# Patient Record
Sex: Male | Born: 1945
Health system: Southern US, Community
[De-identification: ages and names within clinical notes are randomized; demographics above are authoritative.]

## PROBLEM LIST (undated history)

## (undated) DIAGNOSIS — F32A Depression, unspecified: Secondary | ICD-10-CM

## (undated) DIAGNOSIS — K219 Gastro-esophageal reflux disease without esophagitis: Secondary | ICD-10-CM

## (undated) DIAGNOSIS — Z87442 Personal history of urinary calculi: Secondary | ICD-10-CM

## (undated) DIAGNOSIS — R3915 Urgency of urination: Secondary | ICD-10-CM

## (undated) DIAGNOSIS — N400 Enlarged prostate without lower urinary tract symptoms: Secondary | ICD-10-CM

## (undated) DIAGNOSIS — F329 Major depressive disorder, single episode, unspecified: Secondary | ICD-10-CM

## (undated) DIAGNOSIS — K635 Polyp of colon: Secondary | ICD-10-CM

## (undated) DIAGNOSIS — D1803 Hemangioma of intra-abdominal structures: Secondary | ICD-10-CM

## (undated) DIAGNOSIS — Z973 Presence of spectacles and contact lenses: Secondary | ICD-10-CM

## (undated) DIAGNOSIS — F419 Anxiety disorder, unspecified: Secondary | ICD-10-CM

## (undated) DIAGNOSIS — T7840XA Allergy, unspecified, initial encounter: Secondary | ICD-10-CM

## (undated) DIAGNOSIS — E119 Type 2 diabetes mellitus without complications: Secondary | ICD-10-CM

## (undated) DIAGNOSIS — G629 Polyneuropathy, unspecified: Secondary | ICD-10-CM

## (undated) DIAGNOSIS — R319 Hematuria, unspecified: Secondary | ICD-10-CM

## (undated) DIAGNOSIS — N2889 Other specified disorders of kidney and ureter: Secondary | ICD-10-CM

## (undated) DIAGNOSIS — E11319 Type 2 diabetes mellitus with unspecified diabetic retinopathy without macular edema: Secondary | ICD-10-CM

## (undated) DIAGNOSIS — G4733 Obstructive sleep apnea (adult) (pediatric): Secondary | ICD-10-CM

## (undated) DIAGNOSIS — I440 Atrioventricular block, first degree: Secondary | ICD-10-CM

## (undated) DIAGNOSIS — N201 Calculus of ureter: Secondary | ICD-10-CM

## (undated) DIAGNOSIS — J45909 Unspecified asthma, uncomplicated: Secondary | ICD-10-CM

## (undated) DIAGNOSIS — R300 Dysuria: Secondary | ICD-10-CM

## (undated) DIAGNOSIS — M6530 Trigger finger, unspecified finger: Secondary | ICD-10-CM

## (undated) HISTORY — DX: Allergy, unspecified, initial encounter: T78.40XA

## (undated) HISTORY — DX: Polyneuropathy, unspecified: G62.9

## (undated) HISTORY — DX: Atrioventricular block, first degree: I44.0

## (undated) HISTORY — DX: Unspecified asthma, uncomplicated: J45.909

## (undated) HISTORY — DX: Trigger finger, unspecified finger: M65.30

## (undated) HISTORY — DX: Major depressive disorder, single episode, unspecified: F32.9

## (undated) HISTORY — DX: Anxiety disorder, unspecified: F41.9

## (undated) HISTORY — DX: Obstructive sleep apnea (adult) (pediatric): G47.33

## (undated) HISTORY — DX: Type 2 diabetes mellitus with unspecified diabetic retinopathy without macular edema: E11.319

## (undated) HISTORY — DX: Hemangioma of intra-abdominal structures: D18.03

## (undated) HISTORY — DX: Depression, unspecified: F32.A

## (undated) HISTORY — PX: COLONOSCOPY: SHX174

## (undated) HISTORY — DX: Polyp of colon: K63.5

## (undated) HISTORY — DX: Benign prostatic hyperplasia without lower urinary tract symptoms: N40.0

---

## 1999-01-31 ENCOUNTER — Encounter: Admission: RE | Admit: 1999-01-31 | Discharge: 1999-05-01 | Payer: Self-pay | Admitting: Endocrinology

## 2005-05-15 ENCOUNTER — Ambulatory Visit: Payer: Self-pay | Admitting: Internal Medicine

## 2005-05-29 ENCOUNTER — Encounter (INDEPENDENT_AMBULATORY_CARE_PROVIDER_SITE_OTHER): Payer: Self-pay | Admitting: *Deleted

## 2005-05-29 ENCOUNTER — Ambulatory Visit: Payer: Self-pay | Admitting: Internal Medicine

## 2007-02-17 ENCOUNTER — Encounter: Admission: RE | Admit: 2007-02-17 | Discharge: 2007-02-17 | Payer: Self-pay | Admitting: Orthopedic Surgery

## 2007-07-01 ENCOUNTER — Encounter: Admission: RE | Admit: 2007-07-01 | Discharge: 2007-07-01 | Payer: Self-pay | Admitting: Family Medicine

## 2007-07-13 ENCOUNTER — Encounter: Admission: RE | Admit: 2007-07-13 | Discharge: 2007-07-13 | Payer: Self-pay | Admitting: Family Medicine

## 2008-03-16 ENCOUNTER — Encounter: Admission: RE | Admit: 2008-03-16 | Discharge: 2008-03-16 | Payer: Self-pay | Admitting: Family Medicine

## 2008-06-13 ENCOUNTER — Encounter: Admission: RE | Admit: 2008-06-13 | Discharge: 2008-06-13 | Payer: Self-pay | Admitting: Family Medicine

## 2008-07-13 ENCOUNTER — Ambulatory Visit: Payer: Self-pay | Admitting: Internal Medicine

## 2008-08-17 ENCOUNTER — Encounter: Admission: RE | Admit: 2008-08-17 | Discharge: 2008-08-17 | Payer: Self-pay | Admitting: Family Medicine

## 2008-09-11 ENCOUNTER — Ambulatory Visit: Payer: Self-pay | Admitting: Internal Medicine

## 2008-09-26 ENCOUNTER — Ambulatory Visit: Payer: Self-pay | Admitting: Internal Medicine

## 2010-05-26 ENCOUNTER — Encounter: Payer: Self-pay | Admitting: Orthopedic Surgery

## 2010-08-13 LAB — GLUCOSE, CAPILLARY: Glucose-Capillary: 124 mg/dL — ABNORMAL HIGH (ref 70–99)

## 2010-10-17 ENCOUNTER — Ambulatory Visit (HOSPITAL_COMMUNITY): Payer: BC Managed Care – PPO

## 2010-10-17 ENCOUNTER — Ambulatory Visit (HOSPITAL_COMMUNITY)
Admission: RE | Admit: 2010-10-17 | Discharge: 2010-10-17 | Disposition: A | Discharge status: Home / Self Care | Payer: BC Managed Care – PPO | Source: Ambulatory Visit | Attending: Urology | Admitting: Urology

## 2010-10-17 DIAGNOSIS — K219 Gastro-esophageal reflux disease without esophagitis: Secondary | ICD-10-CM | POA: Insufficient documentation

## 2010-10-17 DIAGNOSIS — E119 Type 2 diabetes mellitus without complications: Secondary | ICD-10-CM | POA: Insufficient documentation

## 2010-10-17 DIAGNOSIS — N201 Calculus of ureter: Secondary | ICD-10-CM | POA: Insufficient documentation

## 2010-10-17 DIAGNOSIS — J45909 Unspecified asthma, uncomplicated: Secondary | ICD-10-CM | POA: Insufficient documentation

## 2010-10-17 DIAGNOSIS — Z01812 Encounter for preprocedural laboratory examination: Secondary | ICD-10-CM | POA: Insufficient documentation

## 2010-10-17 HISTORY — PX: EXTRACORPOREAL SHOCK WAVE LITHOTRIPSY: SHX1557

## 2010-10-17 LAB — GLUCOSE, CAPILLARY

## 2010-10-19 ENCOUNTER — Emergency Department (HOSPITAL_COMMUNITY): Payer: BC Managed Care – PPO

## 2010-10-19 ENCOUNTER — Emergency Department (HOSPITAL_COMMUNITY)
Admission: EM | Admit: 2010-10-19 | Discharge: 2010-10-19 | Disposition: A | Discharge status: Home / Self Care | Payer: BC Managed Care – PPO | Attending: Emergency Medicine | Admitting: Emergency Medicine

## 2010-10-19 DIAGNOSIS — R079 Chest pain, unspecified: Secondary | ICD-10-CM | POA: Insufficient documentation

## 2010-10-19 DIAGNOSIS — W19XXXA Unspecified fall, initial encounter: Secondary | ICD-10-CM | POA: Insufficient documentation

## 2010-10-19 DIAGNOSIS — R319 Hematuria, unspecified: Secondary | ICD-10-CM | POA: Insufficient documentation

## 2010-10-19 DIAGNOSIS — M549 Dorsalgia, unspecified: Secondary | ICD-10-CM | POA: Insufficient documentation

## 2010-10-19 DIAGNOSIS — E119 Type 2 diabetes mellitus without complications: Secondary | ICD-10-CM | POA: Insufficient documentation

## 2010-10-19 DIAGNOSIS — R51 Headache: Secondary | ICD-10-CM | POA: Insufficient documentation

## 2010-10-19 LAB — BASIC METABOLIC PANEL
BUN: 25 mg/dL — ABNORMAL HIGH (ref 6–23)
CO2: 28 mEq/L (ref 19–32)
Calcium: 9.7 mg/dL (ref 8.4–10.5)
Chloride: 100 mEq/L (ref 96–112)
Creatinine, Ser: 1.75 mg/dL — ABNORMAL HIGH (ref 0.50–1.35)
GFR calc non Af Amer: 39 mL/min — ABNORMAL LOW (ref >60)
Glucose, Bld: 174 mg/dL — ABNORMAL HIGH (ref 70–99)
Potassium: 4.2 mEq/L (ref 3.5–5.1)

## 2010-10-19 LAB — DIFFERENTIAL
Basophils Absolute: 0 10*3/uL (ref 0.0–0.1)
Basophils Relative: 0 % (ref 0–1)
Eosinophils Absolute: 0.1 10*3/uL (ref 0.0–0.7)
Neutro Abs: 7.1 10*3/uL (ref 1.7–7.7)

## 2010-10-19 LAB — URINALYSIS, ROUTINE W REFLEX MICROSCOPIC
Glucose, UA: 250 mg/dL — AB
Protein, ur: NEGATIVE mg/dL
pH: 5.5 (ref 5.0–8.0)

## 2010-10-19 LAB — URINE MICROSCOPIC-ADD ON

## 2010-10-19 LAB — CBC
HCT: 39 % (ref 39.0–52.0)
MCV: 91.5 fL (ref 78.0–100.0)
RBC: 4.26 MIL/uL (ref 4.22–5.81)
RDW: 11.7 % (ref 11.5–15.5)
WBC: 8.8 10*3/uL (ref 4.0–10.5)

## 2012-12-15 DIAGNOSIS — G4700 Insomnia, unspecified: Secondary | ICD-10-CM | POA: Diagnosis not present

## 2012-12-27 DIAGNOSIS — N200 Calculus of kidney: Secondary | ICD-10-CM | POA: Diagnosis not present

## 2012-12-27 DIAGNOSIS — R82998 Other abnormal findings in urine: Secondary | ICD-10-CM | POA: Diagnosis not present

## 2013-01-25 DIAGNOSIS — R300 Dysuria: Secondary | ICD-10-CM | POA: Diagnosis not present

## 2013-01-25 DIAGNOSIS — R5381 Other malaise: Secondary | ICD-10-CM | POA: Diagnosis not present

## 2013-01-25 DIAGNOSIS — Z125 Encounter for screening for malignant neoplasm of prostate: Secondary | ICD-10-CM | POA: Diagnosis not present

## 2013-01-25 DIAGNOSIS — R319 Hematuria, unspecified: Secondary | ICD-10-CM | POA: Diagnosis not present

## 2013-01-26 DIAGNOSIS — R339 Retention of urine, unspecified: Secondary | ICD-10-CM | POA: Diagnosis not present

## 2013-01-26 DIAGNOSIS — R300 Dysuria: Secondary | ICD-10-CM | POA: Diagnosis not present

## 2013-01-27 DIAGNOSIS — N200 Calculus of kidney: Secondary | ICD-10-CM | POA: Diagnosis not present

## 2013-01-27 DIAGNOSIS — R310 Gross hematuria: Secondary | ICD-10-CM | POA: Diagnosis not present

## 2013-01-27 DIAGNOSIS — N289 Disorder of kidney and ureter, unspecified: Secondary | ICD-10-CM | POA: Diagnosis not present

## 2013-02-15 DIAGNOSIS — Q6231 Congenital ureterocele, orthotopic: Secondary | ICD-10-CM | POA: Diagnosis not present

## 2013-02-15 DIAGNOSIS — N201 Calculus of ureter: Secondary | ICD-10-CM | POA: Diagnosis not present

## 2013-02-15 DIAGNOSIS — R82998 Other abnormal findings in urine: Secondary | ICD-10-CM | POA: Diagnosis not present

## 2013-02-23 ENCOUNTER — Other Ambulatory Visit: Payer: Self-pay | Admitting: Urology

## 2013-03-02 ENCOUNTER — Encounter (HOSPITAL_BASED_OUTPATIENT_CLINIC_OR_DEPARTMENT_OTHER): Payer: Self-pay | Admitting: *Deleted

## 2013-03-03 ENCOUNTER — Encounter (HOSPITAL_BASED_OUTPATIENT_CLINIC_OR_DEPARTMENT_OTHER): Payer: Self-pay | Admitting: *Deleted

## 2013-03-04 ENCOUNTER — Encounter (HOSPITAL_BASED_OUTPATIENT_CLINIC_OR_DEPARTMENT_OTHER): Payer: Self-pay | Admitting: *Deleted

## 2013-03-04 NOTE — Progress Notes (Signed)
NPO AFTER MN. ARRIVE AT 0900. NEEDS ISTAT AND EKG.  

## 2013-03-09 NOTE — H&P (Signed)
ctive Problems Problems  1. Dysuria 788.1 2. Gross Hematuria 599.71 3. Incomplete Emptying Of Bladder 788.21 4. Nephrolithiasis 592.0 5. Pyuria 791.9 6. Urinary Tract Infection 599.0  History of Present Illness  Vincent Eaton returns today with his CT results.   He has had pyuria with dysuria and negative cultures.   His CT shows a 1cm stone in a right ureterocele with what appears to be a dumbbell shape with some stone actually growing out into the bladder lumen as well.   There was a small liver lesion and he has been referred to GI for that issue.  He had some hematuria last week but he has no pain.   Past Medical History Problems  1. History of  Asthma 493.90 2. History of  Depression 311 3. History of  Diabetes Mellitus 250.00 4. History of  Diabetic Peripheral Neuropathy 357.2 5. History of  Esophageal Reflux 530.81 6. History of  Proximal Ureteral Stone On The Right 592.1  Surgical History Problems  1. History of  Lithotripsy  Current Meds 1. Fluticasone Propionate SUSP; Therapy: (Recorded:11Jun2012) to 2. Ibuprofen 200 MG Oral Tablet; Therapy: (Recorded:25Aug2014) to 3. MetFORMIN HCl TABS; Therapy: (Recorded:11Jun2012) to 4. Tamsulosin HCl 0.4 MG Oral Capsule; Therapy: 23Sep2014 to 5. TraZODone HCl 100 MG Oral Tablet; Therapy: (Recorded:11Jun2012) to 6. Uribel 118 MG Oral Capsule; TAKE 1 CAPSULE 4 times daily; Therapy: 24Sep2014 to (Last  Rx:24Sep2014) 7. Ventolin HFA AERS; Therapy: (Recorded:11Jun2012) to  Allergies Medication  1. No Known Drug Allergies  Family History Problems  1. Paternal history of  Diabetes Mellitus V18.0 2. Family history of  Family Health Status Number Of Children 2 sons  Social History Problems  1. Caffeine Use 1 a day 2. Former Smoker V15.82 He was <1ppd for about 20 years. 3. Marital History - Divorced V61.03 4. Occupation: Professor 5. Retired From Work Denied  6. History of  Alcohol Use  Review of Systems  Genitourinary:  feelings of urinary urgency, dysuria and hematuria.  Gastrointestinal: no flank pain.  Constitutional: no fever.  Cardiovascular: no chest pain.  Respiratory: no shortness of breath.    Vitals Vital Signs [Data Includes: Last 1 Day]  14Oct2014 12:44PM  Blood Pressure: 105 / 62 Temperature: 97.6 F Heart Rate: 60  Physical Exam Constitutional: Well nourished and well developed . No acute distress.  Pulmonary: No respiratory distress and normal respiratory rhythm and effort.  Cardiovascular: Heart rate and rhythm are normal . No peripheral edema.    Results/Data Urine [Data Includes: Last 1 Day]   14Oct2014  COLOR YELLOW   APPEARANCE CLEAR   SPECIFIC GRAVITY 1.030   pH 5.5   GLUCOSE NEG mg/dL  BILIRUBIN NEG   KETONE TRACE mg/dL  BLOOD LARGE   PROTEIN 30 mg/dL  UROBILINOGEN 0.2 mg/dL  NITRITE POS   LEUKOCYTE ESTERASE SMALL   SQUAMOUS EPITHELIAL/HPF NONE SEEN   WBC TNTC WBC/hpf  RBC 7-10 RBC/hpf  BACTERIA FEW   CRYSTALS NONE SEEN   CASTS NONE SEEN   Selected Results  AU CT-HEMATURIA PROTOCOL 25Sep2014 12:00AM Seward Grater   Test Name Result Flag Reference  ** RADIOLOGY REPORT BY [facility] **   CLINICAL DATA: Gross and microscopic hematuria. History of renal stones.  EXAM: CT ABDOMEN AND PELVIS WITHOUT AND WITH CONTRAST  TECHNIQUE: Multidetector CT imaging of the abdomen and pelvis was performed without contrast material in one or both body regions, followed by contrast material(s) and further sections in one or both body regions.  CONTRAST: 125 cc Isovue  300  COMPARISON: 10/15/2010  FINDINGS: Lung bases: Centrilobular emphysema. Left basilar scarring. Heart size upper normal, without pericardial or pleural effusion.  Kidneys/Ureters/Bladder: Punctate left renal collecting system stone. No ureteric calculi. Mild right hydroureter. A 1.0 cm stone is positioned within the right side of the bladder. This is likely within a orthotopic  ureterocele when correlated with image 28/ series 7. No suspicious renal mass on post-contrast images. Portions of the left ureter incompletely opacified on delays.  Other: Too small to characterize bilateral liver lesions. The largest measures 11 mm. Normal spleen, stomach, pancreas, gallbladder, biliary tract, adrenal glands. No retroperitoneal or retrocrural adenopathy. Colonic stool burden suggests constipation. Normal small bowel without abdominal ascites. No pelvic adenopathy. Normal prostate, without significant free pelvic fluid.  Bones/Musculoskeletal: No acute osseous abnormality.  IMPRESSION: 1. Right-sided bladder base 1.0 cm stone is favored to be positioned within a or orthotopic right ureterocele. There is likely secondary mild right hydroureter. 2. Left nephrolithiasis. 3. Liver lesions which are indeterminate. If there is any history of primarily malignancy or liver disease, recommend nonemergent pre and post contrast abdominal MRI. 4. Possible constipation.   Electronically Signed  By: Jeronimo Greaves  On: 01/27/2013 17:21   Assessment Assessed  1. Ureteral Stone Right 592.1 2. Congenital Ureterocele Right 753.23 3. Pyuria 791.9   He has a 1cm stone in a right ureterocele and persistent pyuria with negative cultures.   Plan Gross Hematuria (599.71)  1. Cysto Health Maintenance (V70.0)  2. UA With REFLEX  Done: 14Oct2014 12:30PM Nephrolithiasis (592.0)  3. Cysto Pyuria (791.9)  4. URINE CULTURE  Requested for: 14Oct2014 5. Follow-up Schedule Surgery Office  Follow-up  Requested for: 14Oct2014   I am going to get him set up for cystoscopy with incision of ureterocele and stone removal.   I reviewed the risks of bleeding, infection, ureteral stricture, urethral stricture, thrombotic events and anesthetic complications. I will reculture his urine in preparation for the procedure.

## 2013-03-10 ENCOUNTER — Encounter (HOSPITAL_BASED_OUTPATIENT_CLINIC_OR_DEPARTMENT_OTHER)
Admission: RE | Disposition: A | Discharge status: Home / Self Care | Payer: Self-pay | Source: Ambulatory Visit | Attending: Urology

## 2013-03-10 ENCOUNTER — Ambulatory Visit (HOSPITAL_BASED_OUTPATIENT_CLINIC_OR_DEPARTMENT_OTHER): Payer: Medicare Other | Admitting: Anesthesiology

## 2013-03-10 ENCOUNTER — Encounter (HOSPITAL_BASED_OUTPATIENT_CLINIC_OR_DEPARTMENT_OTHER): Payer: Medicare Other | Admitting: Anesthesiology

## 2013-03-10 ENCOUNTER — Encounter (HOSPITAL_BASED_OUTPATIENT_CLINIC_OR_DEPARTMENT_OTHER): Payer: Self-pay | Admitting: Anesthesiology

## 2013-03-10 ENCOUNTER — Ambulatory Visit (HOSPITAL_BASED_OUTPATIENT_CLINIC_OR_DEPARTMENT_OTHER)
Admission: RE | Admit: 2013-03-10 | Discharge: 2013-03-10 | Disposition: A | Discharge status: Home / Self Care | Payer: Medicare Other | Source: Ambulatory Visit | Attending: Urology | Admitting: Urology

## 2013-03-10 DIAGNOSIS — Q6231 Congenital ureterocele, orthotopic: Secondary | ICD-10-CM | POA: Diagnosis not present

## 2013-03-10 DIAGNOSIS — K219 Gastro-esophageal reflux disease without esophagitis: Secondary | ICD-10-CM | POA: Diagnosis not present

## 2013-03-10 DIAGNOSIS — Z79899 Other long term (current) drug therapy: Secondary | ICD-10-CM | POA: Insufficient documentation

## 2013-03-10 DIAGNOSIS — E1149 Type 2 diabetes mellitus with other diabetic neurological complication: Secondary | ICD-10-CM | POA: Insufficient documentation

## 2013-03-10 DIAGNOSIS — N2889 Other specified disorders of kidney and ureter: Secondary | ICD-10-CM | POA: Diagnosis not present

## 2013-03-10 DIAGNOSIS — E1142 Type 2 diabetes mellitus with diabetic polyneuropathy: Secondary | ICD-10-CM | POA: Diagnosis not present

## 2013-03-10 DIAGNOSIS — N201 Calculus of ureter: Secondary | ICD-10-CM | POA: Diagnosis present

## 2013-03-10 DIAGNOSIS — R82998 Other abnormal findings in urine: Secondary | ICD-10-CM | POA: Diagnosis not present

## 2013-03-10 HISTORY — DX: Calculus of ureter: N20.1

## 2013-03-10 HISTORY — DX: Dysuria: R30.0

## 2013-03-10 HISTORY — DX: Gastro-esophageal reflux disease without esophagitis: K21.9

## 2013-03-10 HISTORY — DX: Urgency of urination: R39.15

## 2013-03-10 HISTORY — PX: CYSTOSCOPY/RETROGRADE/URETEROSCOPY: SHX5316

## 2013-03-10 HISTORY — DX: Personal history of urinary calculi: Z87.442

## 2013-03-10 HISTORY — DX: Hematuria, unspecified: R31.9

## 2013-03-10 HISTORY — DX: Presence of spectacles and contact lenses: Z97.3

## 2013-03-10 HISTORY — DX: Type 2 diabetes mellitus without complications: E11.9

## 2013-03-10 HISTORY — DX: Other specified disorders of kidney and ureter: N28.89

## 2013-03-10 LAB — POCT I-STAT 4, (NA,K, GLUC, HGB,HCT)
Glucose, Bld: 135 mg/dL — ABNORMAL HIGH (ref 70–99)
Potassium: 4.8 mEq/L (ref 3.5–5.1)

## 2013-03-10 SURGERY — CYSTOSCOPY/RETROGRADE/URETEROSCOPY
Anesthesia: General | Laterality: Right

## 2013-03-10 MED ORDER — ACETAMINOPHEN 325 MG PO TABS
650.0000 mg | ORAL_TABLET | ORAL | Status: DC | PRN
  Filled 2013-03-10: qty 2

## 2013-03-10 MED ORDER — LACTATED RINGERS IV SOLN
INTRAVENOUS | Status: DC
  Administered 2013-03-10: 10:00:00 via INTRAVENOUS
  Filled 2013-03-10: qty 1000

## 2013-03-10 MED ORDER — PROMETHAZINE HCL 25 MG/ML IJ SOLN
6.2500 mg | INTRAMUSCULAR | Status: DC | PRN
  Filled 2013-03-10: qty 1

## 2013-03-10 MED ORDER — HYDROCODONE-ACETAMINOPHEN 5-325 MG PO TABS: 1.0000 | ORAL_TABLET | Freq: Four times a day (QID) | ORAL | Status: DC | PRN

## 2013-03-10 MED ORDER — MEPERIDINE HCL 25 MG/ML IJ SOLN
6.2500 mg | INTRAMUSCULAR | Status: DC | PRN
  Filled 2013-03-10: qty 1

## 2013-03-10 MED ORDER — CIPROFLOXACIN IN D5W 400 MG/200ML IV SOLN
400.0000 mg | INTRAVENOUS | Status: AC
  Administered 2013-03-10: 400 mg via INTRAVENOUS
  Filled 2013-03-10: qty 200

## 2013-03-10 MED ORDER — ONDANSETRON HCL 4 MG/2ML IJ SOLN
INTRAMUSCULAR | Status: DC | PRN
  Administered 2013-03-10: 4 mg via INTRAVENOUS

## 2013-03-10 MED ORDER — OXYCODONE HCL 5 MG PO TABS
5.0000 mg | ORAL_TABLET | ORAL | Status: DC | PRN
  Filled 2013-03-10: qty 2

## 2013-03-10 MED ORDER — HYDROMORPHONE HCL PF 1 MG/ML IJ SOLN
0.2500 mg | INTRAMUSCULAR | Status: DC | PRN
  Filled 2013-03-10: qty 1

## 2013-03-10 MED ORDER — SODIUM CHLORIDE 0.9 % IJ SOLN
3.0000 mL | INTRAMUSCULAR | Status: DC | PRN
  Filled 2013-03-10: qty 3

## 2013-03-10 MED ORDER — OXYCODONE HCL 5 MG/5ML PO SOLN
5.0000 mg | Freq: Once | ORAL | Status: DC | PRN
  Filled 2013-03-10: qty 5

## 2013-03-10 MED ORDER — MIDAZOLAM HCL 5 MG/5ML IJ SOLN
INTRAMUSCULAR | Status: DC | PRN
  Administered 2013-03-10: 1 mg via INTRAVENOUS

## 2013-03-10 MED ORDER — DEXAMETHASONE SODIUM PHOSPHATE 4 MG/ML IJ SOLN
INTRAMUSCULAR | Status: DC | PRN
  Administered 2013-03-10: 10 mg via INTRAVENOUS

## 2013-03-10 MED ORDER — SODIUM CHLORIDE 0.9 % IR SOLN
Status: DC | PRN
  Administered 2013-03-10: 3000 mL

## 2013-03-10 MED ORDER — ACETAMINOPHEN 650 MG RE SUPP
650.0000 mg | RECTAL | Status: DC | PRN
  Filled 2013-03-10: qty 1

## 2013-03-10 MED ORDER — LIDOCAINE HCL (CARDIAC) 20 MG/ML IV SOLN
INTRAVENOUS | Status: DC | PRN
  Administered 2013-03-10: 60 mg via INTRAVENOUS

## 2013-03-10 MED ORDER — FENTANYL CITRATE 0.05 MG/ML IJ SOLN
25.0000 ug | INTRAMUSCULAR | Status: DC | PRN
  Filled 2013-03-10: qty 1

## 2013-03-10 MED ORDER — SODIUM CHLORIDE 0.9 % IV SOLN
250.0000 mL | INTRAVENOUS | Status: DC | PRN
  Filled 2013-03-10: qty 250

## 2013-03-10 MED ORDER — PHENAZOPYRIDINE HCL 200 MG PO TABS: 200.0000 mg | ORAL_TABLET | Freq: Three times a day (TID) | ORAL | Status: DC | PRN

## 2013-03-10 MED ORDER — PROPOFOL 10 MG/ML IV BOLUS
INTRAVENOUS | Status: DC | PRN
  Administered 2013-03-10: 170 mg via INTRAVENOUS

## 2013-03-10 MED ORDER — SODIUM CHLORIDE 0.9 % IJ SOLN
3.0000 mL | Freq: Two times a day (BID) | INTRAMUSCULAR | Status: DC
  Filled 2013-03-10: qty 3

## 2013-03-10 MED ORDER — FENTANYL CITRATE 0.05 MG/ML IJ SOLN
INTRAMUSCULAR | Status: DC | PRN
  Administered 2013-03-10: 50 ug via INTRAVENOUS
  Administered 2013-03-10 (×2): 25 ug via INTRAVENOUS

## 2013-03-10 MED ORDER — OXYCODONE HCL 5 MG PO TABS
5.0000 mg | ORAL_TABLET | Freq: Once | ORAL | Status: DC | PRN
  Filled 2013-03-10: qty 1

## 2013-03-10 MED ORDER — ONDANSETRON HCL 4 MG/2ML IJ SOLN
4.0000 mg | Freq: Four times a day (QID) | INTRAMUSCULAR | Status: DC | PRN
  Filled 2013-03-10: qty 2

## 2013-03-10 MED ORDER — KETOROLAC TROMETHAMINE 30 MG/ML IJ SOLN
INTRAMUSCULAR | Status: DC | PRN
  Administered 2013-03-10: 30 mg via INTRAVENOUS

## 2013-03-10 SURGICAL SUPPLY — 38 items
BAG DRAIN URO-CYSTO SKYTR STRL (DRAIN) ×2 IMPLANT
BAG DRN UROCATH (DRAIN) ×1
BASKET LASER NITINOL 1.9FR (BASKET) IMPLANT
BASKET SEGURA 3FR (UROLOGICAL SUPPLIES) IMPLANT
BASKET STNLS GEMINI 4WIRE 3FR (BASKET) IMPLANT
BASKET ZERO TIP NITINOL 2.4FR (BASKET) IMPLANT
BRUSH URET BIOPSY 3F (UROLOGICAL SUPPLIES) IMPLANT
BSKT STON RTRVL 120 1.9FR (BASKET)
BSKT STON RTRVL GEM 120X11 3FR (BASKET)
BSKT STON RTRVL ZERO TP 2.4FR (BASKET)
CANISTER SUCT LVC 12 LTR MEDI- (MISCELLANEOUS) ×1 IMPLANT
CATH URET 5FR 28IN CONE TIP (BALLOONS)
CATH URET 5FR 28IN OPEN ENDED (CATHETERS) ×2 IMPLANT
CATH URET 5FR 70CM CONE TIP (BALLOONS) IMPLANT
CLOTH BEACON ORANGE TIMEOUT ST (SAFETY) ×2 IMPLANT
DRAPE CAMERA CLOSED 9X96 (DRAPES) ×2 IMPLANT
ELECT HF RESECT BIPO 24F 45 ND (CUTTING LOOP) ×1 IMPLANT
ELECT REM PT RETURN 9FT ADLT (ELECTROSURGICAL)
ELECTRODE REM PT RTRN 9FT ADLT (ELECTROSURGICAL) IMPLANT
FIBER LASER FLEXIVA 1000 (UROLOGICAL SUPPLIES) IMPLANT
FIBER LASER FLEXIVA 200 (UROLOGICAL SUPPLIES) IMPLANT
FIBER LASER FLEXIVA 365 (UROLOGICAL SUPPLIES) IMPLANT
FIBER LASER FLEXIVA 550 (UROLOGICAL SUPPLIES) IMPLANT
GLOVE SURG SS PI 8.0 STRL IVOR (GLOVE) ×2 IMPLANT
GOWN STRL REIN XL XLG (GOWN DISPOSABLE) ×2 IMPLANT
GOWN XL W/COTTON TOWEL STD (GOWNS) ×2 IMPLANT
GUIDEWIRE 0.038 PTFE COATED (WIRE) IMPLANT
GUIDEWIRE ANG ZIPWIRE 038X150 (WIRE) IMPLANT
GUIDEWIRE STR DUAL SENSOR (WIRE) ×1 IMPLANT
IV NS IRRIG 3000ML ARTHROMATIC (IV SOLUTION) ×4 IMPLANT
KIT BALLIN UROMAX 15FX10 (LABEL) IMPLANT
KIT BALLN UROMAX 15FX4 (MISCELLANEOUS) IMPLANT
KIT BALLN UROMAX 26 75X4 (MISCELLANEOUS)
NS IRRIG 500ML POUR BTL (IV SOLUTION) ×1 IMPLANT
PACK CYSTOSCOPY (CUSTOM PROCEDURE TRAY) ×2 IMPLANT
SET HIGH PRES BAL DIL (LABEL)
SHEATH ACCESS URETERAL 38CM (SHEATH) IMPLANT
SHEATH ACCESS URETERAL 54CM (SHEATH) IMPLANT

## 2013-03-10 NOTE — Transfer of Care (Signed)
Immediate Anesthesia Transfer of Care Note  Patient: Vincent Eaton  Procedure(s) Performed: Procedure(s) (LRB): CYSTOSCOPY UNROOF URETEROCELE STONE EXTRACTION (Right)  Patient Location: PACU  Anesthesia Type: General  Level of Consciousness: awake, sedated, patient cooperative and responds to stimulation  Airway & Oxygen Therapy: Patient Spontanous Breathing and Patient connected to face mask oxygen  Post-op Assessment: Report given to PACU RN, Post -op Vital signs reviewed and stable and Patient moving all extremities  Post vital signs: Reviewed and stable  Complications: No apparent anesthesia complications

## 2013-03-10 NOTE — Anesthesia Procedure Notes (Signed)
Procedure Name: LMA Insertion Date/Time: 03/10/2013 10:15 AM Performed by: Renella Cunas D Pre-anesthesia Checklist: Patient identified, Emergency Drugs available, Suction available and Patient being monitored Patient Re-evaluated:Patient Re-evaluated prior to inductionOxygen Delivery Method: Circle System Utilized Preoxygenation: Pre-oxygenation with 100% oxygen Intubation Type: IV induction Ventilation: Mask ventilation without difficulty LMA: LMA inserted LMA Size: 4.0 Number of attempts: 1 Airway Equipment and Method: bite block Placement Confirmation: positive ETCO2 Tube secured with: Tape Dental Injury: Teeth and Oropharynx as per pre-operative assessment

## 2013-03-10 NOTE — Interval H&P Note (Signed)
History and Physical Interval Note:  03/10/2013 9:32 AM  [name]  has presented today for surgery, with the diagnosis of RIGHT URETERAL STONE IN URETEROCELE  The various methods of treatment have been discussed with the patient and family. After consideration of risks, benefits and other options for treatment, the patient has consented to  Procedure(s): CYSTOSCOPY UNROOF URETEROCELE STONE EXTRACTION (Right) HOLMIUM LASER APPLICATION (Right) as a surgical intervention .  The patient's history has been reviewed, patient examined, no change in status, stable for surgery.  I have reviewed the patient's chart and labs.  Questions were answered to the patient's satisfaction.     Vincent Eaton

## 2013-03-10 NOTE — Anesthesia Preprocedure Evaluation (Signed)
Anesthesia Evaluation  Patient identified by MRN, date of birth, ID band Patient awake    Reviewed: Allergy & Precautions, H&P , NPO status , Patient's Chart, lab work & pertinent test results  Airway       Dental  (+) Dental Advisory Given   Pulmonary former smoker,          Cardiovascular negative cardio ROS      Neuro/Psych negative neurological ROS  negative psych ROS   GI/Hepatic Neg liver ROS, GERD-  Medicated,  Endo/Other  diabetes, Type 2, Oral Hypoglycemic Agents  Renal/GU negative Renal ROS     Musculoskeletal negative musculoskeletal ROS (+)   Abdominal   Peds  Hematology negative hematology ROS (+)   Anesthesia Other Findings   Reproductive/Obstetrics negative OB ROS                           Anesthesia Physical Anesthesia Plan  ASA: II  Anesthesia Plan: General   Post-op Pain Management:    Induction: Intravenous  Airway Management Planned: LMA  Additional Equipment:   Intra-op Plan:   Post-operative Plan: Extubation in OR  Informed Consent: I have reviewed the patients History and Physical, chart, labs and discussed the procedure including the risks, benefits and alternatives for the proposed anesthesia with the patient or authorized representative who has indicated his/her understanding and acceptance.   Dental advisory given  Plan Discussed with: CRNA  Anesthesia Plan Comments:         Anesthesia Quick Evaluation

## 2013-03-10 NOTE — Brief Op Note (Signed)
03/10/2013  10:36 AM  PATIENT:  Vincent Eaton  67 y.o. male  PRE-OPERATIVE DIAGNOSIS:  RIGHT URETERAL STONE IN URETEROCELE  POST-OPERATIVE DIAGNOSIS:  RIGHT URETERAL STONE IN URETEROCELE  PROCEDURE:  Procedure(s): CYSTOSCOPY UNROOF URETEROCELE STONE EXTRACTION (Right)  SURGEON:  Surgeon(s) and Role:    * Bjorn Pippin, MD - Primary  PHYSICIAN ASSISTANT:   ASSISTANTS: none   ANESTHESIA:   general  EBL:  Total I/O In: 200 [I.V.:200] Out: -   BLOOD ADMINISTERED:none  DRAINS: none   LOCAL MEDICATIONS USED:  NONE  SPECIMEN:  Source of Specimen:  right ureteral stone  DISPOSITION OF SPECIMEN:  to patient to bring to office  COUNTS:  YES  TOURNIQUET:  * No tourniquets in log *  DICTATION: .Other Dictation: Dictation Number [id number]  PLAN OF CARE: Discharge to home after PACU  PATIENT DISPOSITION:  PACU - hemodynamically stable.   Delay start of Pharmacological VTE agent (>24hrs) due to surgical blood loss or risk of bleeding: not applicable

## 2013-03-11 ENCOUNTER — Encounter (HOSPITAL_BASED_OUTPATIENT_CLINIC_OR_DEPARTMENT_OTHER): Payer: Self-pay | Admitting: Urology

## 2013-03-11 NOTE — Op Note (Signed)
NAMEJAYVEON, CONVEY NO.:  [id number]  MEDICAL RECORD NO.:  [id number]  LOCATION:                               FACILITY:  [facility]  PHYSICIAN:  Excell Seltzer. Annabell Howells, M.D.    DATE OF BIRTH:  25-Aug-1945  DATE OF PROCEDURE:  03/10/2013 DATE OF DISCHARGE:  03/10/2013                              OPERATIVE REPORT   PROCEDURE:  Cystoscopy with unroofing of right ureterocele and removal of right ureteral stone.  PREOPERATIVE DIAGNOSIS:  Right ureteral stone and a ureterocele.  POSTOPERATIVE DIAGNOSIS:  Right ureteral stone and a ureterocele.  SURGEON:  Excell Seltzer. Annabell Howells, MD  ANESTHESIA:  General.  SPECIMEN:  Stone fragments.  DRAINS:  None.  COMPLICATIONS:  None.  INDICATIONS:  Mr. Ozer is a 67 year old male, who initially presented with pyuria evaluation and demonstrated a stone that was partially in and partially out of a right ureterocele and he presents today for removal of the stone.  FINDINGS OF PROCEDURE:  He was given Cipro.  He was taken to the operating room where general anesthetic was induced.  He was placed in lithotomy position and fitted with PAS hose.  His perineum and genitalia were prepped with Betadine solution and draped in usual sterile fashion.  Cystoscopy was performed with a 22-French scope using the 12-degree lens.  Examination revealed a normal urethra.  The membranous urethra was very tight with some blanching suggestive of a stricture.  The prostatic urethra was short with bilobar hyperplasia without significant obstruction.  Examination of bladder revealed mild trabeculation on the right posterior wall.  There was a large area of inflammatory change with a central ulceration that appeared to be secondary to a stony protrusion from the right ureteral orifice with a large amount of dystrophic calcification on top of a more traditional oxalate-appearing stone.  The left ureteral orifice was unremarkable.  The right ureteral orifice had a  stone as noted.  Once thorough inspection had been performed, a grasping forceps was passed through the scope and the exposed stone was grasped.  It broke off leaving a component in the ureterocele and a component in the bladder, which was then aspirated.  The urethra was then calibrated to 30-French with Sissy Hoff sounds and a 28-French continuous flow resectoscope sheath was inserted without difficulty.  This was fitted with an iglesias handle with a Gyrus Collins knife and a 12-degree lens.  The tip of the Collins knife was then used to incise the roof of the ureterocele and then it was used to tease the stone into the bladder.  At this point, the stone was evacuated from the bladder and the edges of the incision were fulgurated as needed for hemostasis.  Once hemostasis was assured and the stone was removed, the bladder was drained.  The scope was removed.  The patient was taken down from lithotomy position.  His anesthetic was reversed.  He was moved to recovery room in stable condition.  There were no complications.     Excell Seltzer. Annabell Howells, M.D.     JJW/MEDQ  D:  03/10/2013  T:  03/11/2013  Job:  [id number]

## 2013-03-11 NOTE — Anesthesia Postprocedure Evaluation (Signed)
Anesthesia Post Note  Patient: Vincent Eaton  Procedure(s) Performed: Procedure(s) (LRB): CYSTOSCOPY UNROOF URETEROCELE STONE EXTRACTION (Right)  Anesthesia type: General  Patient location: PACU  Post pain: Pain level controlled  Post assessment: Post-op Vital signs reviewed  Last Vitals: BP 127/66  Pulse 55  Temp(Src) 36.9 C (Oral)  Resp 11  Ht 5\' 8"  (1.727 m)  Wt 128 lb 5 oz (58.202 kg)  BMI 19.51 kg/m2  SpO2 100%  Post vital signs: Reviewed  Level of consciousness: sedated  Complications: No apparent anesthesia complications

## 2013-03-17 DIAGNOSIS — N201 Calculus of ureter: Secondary | ICD-10-CM | POA: Diagnosis not present

## 2013-04-09 DIAGNOSIS — Z23 Encounter for immunization: Secondary | ICD-10-CM | POA: Diagnosis not present

## 2013-04-13 DIAGNOSIS — E1149 Type 2 diabetes mellitus with other diabetic neurological complication: Secondary | ICD-10-CM | POA: Diagnosis not present

## 2013-04-13 DIAGNOSIS — F4320 Adjustment disorder, unspecified: Secondary | ICD-10-CM | POA: Diagnosis not present

## 2013-04-13 DIAGNOSIS — E1142 Type 2 diabetes mellitus with diabetic polyneuropathy: Secondary | ICD-10-CM | POA: Diagnosis not present

## 2013-04-14 DIAGNOSIS — I440 Atrioventricular block, first degree: Secondary | ICD-10-CM | POA: Diagnosis not present

## 2013-04-14 DIAGNOSIS — Z0000 Encounter for general adult medical examination without abnormal findings: Secondary | ICD-10-CM | POA: Diagnosis not present

## 2013-04-14 DIAGNOSIS — E1149 Type 2 diabetes mellitus with other diabetic neurological complication: Secondary | ICD-10-CM | POA: Diagnosis not present

## 2013-04-14 DIAGNOSIS — E119 Type 2 diabetes mellitus without complications: Secondary | ICD-10-CM | POA: Diagnosis not present

## 2013-07-06 DIAGNOSIS — E11329 Type 2 diabetes mellitus with mild nonproliferative diabetic retinopathy without macular edema: Secondary | ICD-10-CM | POA: Diagnosis not present

## 2013-07-06 DIAGNOSIS — H2510 Age-related nuclear cataract, unspecified eye: Secondary | ICD-10-CM | POA: Diagnosis not present

## 2013-07-06 DIAGNOSIS — H04129 Dry eye syndrome of unspecified lacrimal gland: Secondary | ICD-10-CM | POA: Diagnosis not present

## 2013-07-06 DIAGNOSIS — E1139 Type 2 diabetes mellitus with other diabetic ophthalmic complication: Secondary | ICD-10-CM | POA: Diagnosis not present

## 2013-08-11 ENCOUNTER — Encounter: Payer: Self-pay | Admitting: Internal Medicine

## 2013-09-13 DIAGNOSIS — R932 Abnormal findings on diagnostic imaging of liver and biliary tract: Secondary | ICD-10-CM | POA: Diagnosis not present

## 2013-09-13 DIAGNOSIS — Z8659 Personal history of other mental and behavioral disorders: Secondary | ICD-10-CM | POA: Diagnosis not present

## 2013-09-13 DIAGNOSIS — G4733 Obstructive sleep apnea (adult) (pediatric): Secondary | ICD-10-CM | POA: Diagnosis not present

## 2013-09-13 DIAGNOSIS — E1142 Type 2 diabetes mellitus with diabetic polyneuropathy: Secondary | ICD-10-CM | POA: Diagnosis not present

## 2013-09-15 ENCOUNTER — Ambulatory Visit (INDEPENDENT_AMBULATORY_CARE_PROVIDER_SITE_OTHER): Payer: Medicare Other | Admitting: Pulmonary Disease

## 2013-09-15 ENCOUNTER — Encounter: Payer: Self-pay | Admitting: Pulmonary Disease

## 2013-09-15 ENCOUNTER — Encounter (INDEPENDENT_AMBULATORY_CARE_PROVIDER_SITE_OTHER): Payer: Self-pay

## 2013-09-15 VITALS — BP 134/62 | HR 61 | Ht 68.0 in | Wt 137.0 lb

## 2013-09-15 DIAGNOSIS — G4710 Hypersomnia, unspecified: Secondary | ICD-10-CM

## 2013-09-15 DIAGNOSIS — R109 Unspecified abdominal pain: Secondary | ICD-10-CM

## 2013-09-15 NOTE — Progress Notes (Deleted)
   Subjective:    Patient ID: Vincent Eaton, male    DOB: [birth year], 68 y.o.   MRN: [id number]  HPI    Review of Systems  Constitutional: Positive for fatigue. Negative for fever and unexpected weight change.  HENT: Positive for sinus pressure. Negative for congestion, dental problem, ear pain, nosebleeds, postnasal drip, rhinorrhea, sneezing, sore throat and trouble swallowing.   Eyes: Negative for redness and itching.  Respiratory: Negative for cough, chest tightness, shortness of breath and wheezing.   Cardiovascular: Negative for palpitations and leg swelling.  Gastrointestinal: Negative for nausea and vomiting.  Genitourinary: Negative for dysuria.  Musculoskeletal: Negative for joint swelling.  Skin: Negative for rash.  Neurological: Positive for headaches.  Hematological: Does not bruise/bleed easily.  Psychiatric/Behavioral: Negative for dysphoric mood. The patient is not nervous/anxious.        Objective:   Physical Exam        Assessment & Plan:
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
## 2013-09-15 NOTE — Patient Instructions (Signed)
Will arrange for sleep study Will call to arrange for follow up after sleep study reviewed 

## 2013-09-16 ENCOUNTER — Encounter: Payer: Self-pay | Admitting: Pulmonary Disease

## 2013-09-16 DIAGNOSIS — R109 Unspecified abdominal pain: Secondary | ICD-10-CM | POA: Insufficient documentation

## 2013-09-16 DIAGNOSIS — G4710 Hypersomnia, unspecified: Secondary | ICD-10-CM | POA: Insufficient documentation

## 2013-09-16 NOTE — Assessment & Plan Note (Signed)
Difficult to determine what the cause of this is.    Some of this may be related to insufficient sleep at night, and we discussed need to ensure adequate sleep time at night.  He reports prior history of sleep apnea, and has upper airway findings that increase his risk for sleep apnea.  To further assess will arrange for in lab sleep study.  If this is unrevealing then he would likely need a multiple sleep latency test and/or maintenance of wakefulness test.  He does not have typical symptoms to go along with diagnosis of narcolepsy.  Some of his sleep issues may be related to anxiety and stress related to his home situation.  Will revisit this at next visit after review of his sleep study.

## 2013-09-16 NOTE — Assessment & Plan Note (Signed)
He has frequent NSAID use.  He reports intermittent gnawing hunger pains, and reflux.  I have advised him to avoid using NSAIDs for now, and to discuss further with his primary physician.

## 2013-09-21 DIAGNOSIS — R300 Dysuria: Secondary | ICD-10-CM | POA: Diagnosis not present

## 2013-09-21 DIAGNOSIS — Z87442 Personal history of urinary calculi: Secondary | ICD-10-CM | POA: Diagnosis not present

## 2013-09-27 ENCOUNTER — Other Ambulatory Visit: Payer: Self-pay | Admitting: Internal Medicine

## 2013-09-27 DIAGNOSIS — R932 Abnormal findings on diagnostic imaging of liver and biliary tract: Secondary | ICD-10-CM

## 2013-09-27 DIAGNOSIS — R9389 Abnormal findings on diagnostic imaging of other specified body structures: Secondary | ICD-10-CM

## 2013-09-27 DIAGNOSIS — G4700 Insomnia, unspecified: Secondary | ICD-10-CM | POA: Diagnosis not present

## 2013-09-27 DIAGNOSIS — F411 Generalized anxiety disorder: Secondary | ICD-10-CM | POA: Diagnosis not present

## 2013-09-27 DIAGNOSIS — T887XXA Unspecified adverse effect of drug or medicament, initial encounter: Secondary | ICD-10-CM | POA: Diagnosis not present

## 2013-09-27 DIAGNOSIS — G4733 Obstructive sleep apnea (adult) (pediatric): Secondary | ICD-10-CM | POA: Diagnosis not present

## 2013-09-27 DIAGNOSIS — Z79899 Other long term (current) drug therapy: Secondary | ICD-10-CM | POA: Diagnosis not present

## 2013-09-27 DIAGNOSIS — M6530 Trigger finger, unspecified finger: Secondary | ICD-10-CM | POA: Diagnosis not present

## 2013-10-10 ENCOUNTER — Ambulatory Visit
Admission: RE | Admit: 2013-10-10 | Discharge: 2013-10-10 | Disposition: A | Discharge status: Home / Self Care | Payer: Medicare Other | Source: Ambulatory Visit | Attending: Internal Medicine | Admitting: Internal Medicine

## 2013-10-10 DIAGNOSIS — R932 Abnormal findings on diagnostic imaging of liver and biliary tract: Secondary | ICD-10-CM

## 2013-10-10 DIAGNOSIS — D1803 Hemangioma of intra-abdominal structures: Secondary | ICD-10-CM | POA: Diagnosis not present

## 2013-10-31 DIAGNOSIS — E119 Type 2 diabetes mellitus without complications: Secondary | ICD-10-CM | POA: Diagnosis not present

## 2013-10-31 DIAGNOSIS — K219 Gastro-esophageal reflux disease without esophagitis: Secondary | ICD-10-CM | POA: Diagnosis not present

## 2013-10-31 DIAGNOSIS — Z8659 Personal history of other mental and behavioral disorders: Secondary | ICD-10-CM | POA: Diagnosis not present

## 2013-11-21 ENCOUNTER — Encounter (HOSPITAL_BASED_OUTPATIENT_CLINIC_OR_DEPARTMENT_OTHER): Payer: Medicare Other

## 2013-11-21 DIAGNOSIS — G4700 Insomnia, unspecified: Secondary | ICD-10-CM | POA: Diagnosis not present

## 2013-11-21 DIAGNOSIS — D1803 Hemangioma of intra-abdominal structures: Secondary | ICD-10-CM | POA: Diagnosis not present

## 2013-11-21 DIAGNOSIS — G4733 Obstructive sleep apnea (adult) (pediatric): Secondary | ICD-10-CM | POA: Diagnosis not present

## 2013-11-21 DIAGNOSIS — J45909 Unspecified asthma, uncomplicated: Secondary | ICD-10-CM | POA: Diagnosis not present

## 2013-11-21 DIAGNOSIS — IMO0001 Reserved for inherently not codable concepts without codable children: Secondary | ICD-10-CM | POA: Diagnosis not present

## 2013-11-28 ENCOUNTER — Ambulatory Visit: Payer: Self-pay | Admitting: Sports Medicine

## 2013-11-28 ENCOUNTER — Ambulatory Visit: Payer: Medicare Other | Admitting: Sports Medicine

## 2013-12-12 ENCOUNTER — Encounter: Payer: Self-pay | Admitting: Sports Medicine

## 2013-12-12 ENCOUNTER — Ambulatory Visit (INDEPENDENT_AMBULATORY_CARE_PROVIDER_SITE_OTHER): Payer: Medicare Other | Admitting: Sports Medicine

## 2013-12-12 VITALS — BP 139/69 | HR 69 | Ht 68.0 in | Wt 135.0 lb

## 2013-12-12 DIAGNOSIS — G609 Hereditary and idiopathic neuropathy, unspecified: Secondary | ICD-10-CM

## 2013-12-12 NOTE — Progress Notes (Signed)
   Subjective:    Patient ID: Vincent Eaton, male    DOB: Nov 14, 1945, 68 y.o.   MRN: [id number]  HPI chief complaint: Bilateral lower extremity pain  68 year old male with known bilateral lower extremity neuropathy comes in at the request of Dr.Pharr for evaluation. Patient states that he has had neuropathy for several years. He has been told that it is due to his diabetes but his diabetes is under excellent control. He is questioning whether or not he may in fact have fibromyalgia. He does have intermittent low back pain but it is not severe. He has tried 800 mg ibuprofen in the past with good results but a cause some stomach upset and he has had to drop the dose down to 400 mg. He still notices benefit with this. His main discomfort is primarily in the evenings. He has not noticed any swelling. He has been treated by Dr[facility]Tonuzi at [facility] neurology with multiple medications but he is unsure of their names. He does state that he had an EMG/nerve conduction study done fairly recently. Patient has done some research on his own and is interested in possibly trying Lyrica. He denies weakness in his legs.  Past medical history and current medications reviewed. No known drug allergies    Review of Systems     Objective:   Physical Exam Thin. No acute distress. Awake alert and oriented x3. Vital signs reviewed.\  Good lumbar range of motion. No trigger points or tender points to palpation along the axial skeleton, shoulders, or hips. No tender points around the knees. Negative straight leg raise bilaterally. Strength is 5/5 both lower extremities. No atrophy. Sensation appears to be intact to light touch grossly. Reflexes are trace but equal at the Achilles and patellar tendons. No clonus.       Assessment & Plan:  Chronic bilateral lower extremity neuropathy  I'm going to start with getting the records from Dr Everette Rank. I do not think the patient has fibromyalgia but I do think it may  help his neuropathy to try some Lyrica. I've explained to the patient that sometimes insurance will not approve this and let us he has failed other neuroleptics. Therefore, I will call the patient after I review his records from Dr Everette Rank and we will decide at that point whether or not to try Lyrica.

## 2013-12-21 ENCOUNTER — Encounter: Payer: Self-pay | Admitting: Internal Medicine

## 2013-12-30 ENCOUNTER — Telehealth: Payer: Self-pay | Admitting: Sports Medicine

## 2013-12-30 NOTE — Telephone Encounter (Signed)
Message copied by Thurman Coyer on Fri Dec 30, 2013  9:36 AM ------      Message from: CERESI, Threasa Beards L      Created: Thu Dec 29, 2013  3:40 PM      Regarding: RE: follow up      Contact: [phone]       He doesn't want an appt. He is going to call you tomorrow morning.      ----- Message -----         From: Thurman Coyer, DO         Sent: 12/28/2013  10:04 AM           To: Melanie L Ceresi      Subject: follow up                                                I've tried several times to reach this patient by telephone. It may be best that we schedule a followup appointment here in the office instead.            ----- Message -----         From: Babette Relic         Sent: 12/22/2013   1:41 PM           To: Thurman Coyer, DO            Calling regarding results             ------

## 2013-12-30 NOTE — Telephone Encounter (Signed)
I have made multiple attempts to contact this patient. I have received and reviewed his old records. I have recommended that the patient return to the office of that we can discuss his case face-to-face. At the time of this dictation patient was not interested in a return visit and stated that he would try to contact me directly sometime in the future.

## 2014-01-04 ENCOUNTER — Encounter: Payer: Self-pay | Admitting: Sports Medicine

## 2014-01-13 DIAGNOSIS — E1149 Type 2 diabetes mellitus with other diabetic neurological complication: Secondary | ICD-10-CM | POA: Diagnosis not present

## 2014-01-31 DIAGNOSIS — E119 Type 2 diabetes mellitus without complications: Secondary | ICD-10-CM | POA: Diagnosis not present

## 2014-01-31 DIAGNOSIS — H2510 Age-related nuclear cataract, unspecified eye: Secondary | ICD-10-CM | POA: Diagnosis not present

## 2014-02-08 DIAGNOSIS — E1140 Type 2 diabetes mellitus with diabetic neuropathy, unspecified: Secondary | ICD-10-CM | POA: Diagnosis not present

## 2014-02-08 DIAGNOSIS — I440 Atrioventricular block, first degree: Secondary | ICD-10-CM | POA: Diagnosis not present

## 2014-02-08 DIAGNOSIS — Z0000 Encounter for general adult medical examination without abnormal findings: Secondary | ICD-10-CM | POA: Diagnosis not present

## 2014-02-08 DIAGNOSIS — Z125 Encounter for screening for malignant neoplasm of prostate: Secondary | ICD-10-CM | POA: Diagnosis not present

## 2014-02-08 DIAGNOSIS — Z23 Encounter for immunization: Secondary | ICD-10-CM | POA: Diagnosis not present

## 2014-02-13 DIAGNOSIS — E1149 Type 2 diabetes mellitus with other diabetic neurological complication: Secondary | ICD-10-CM | POA: Diagnosis not present

## 2014-02-13 DIAGNOSIS — Z1212 Encounter for screening for malignant neoplasm of rectum: Secondary | ICD-10-CM | POA: Diagnosis not present

## 2014-02-13 DIAGNOSIS — Z87448 Personal history of other diseases of urinary system: Secondary | ICD-10-CM | POA: Diagnosis not present

## 2014-02-13 DIAGNOSIS — R400 Somnolence: Secondary | ICD-10-CM | POA: Diagnosis not present

## 2014-02-13 DIAGNOSIS — Z87442 Personal history of urinary calculi: Secondary | ICD-10-CM | POA: Diagnosis not present

## 2014-02-15 ENCOUNTER — Ambulatory Visit: Payer: Medicare Other | Admitting: Sports Medicine

## 2014-02-20 DIAGNOSIS — Z23 Encounter for immunization: Secondary | ICD-10-CM | POA: Diagnosis not present

## 2014-02-22 DIAGNOSIS — M542 Cervicalgia: Secondary | ICD-10-CM | POA: Diagnosis not present

## 2014-02-22 DIAGNOSIS — M5030 Other cervical disc degeneration, unspecified cervical region: Secondary | ICD-10-CM | POA: Diagnosis not present

## 2014-02-28 ENCOUNTER — Encounter: Payer: Self-pay | Admitting: Sports Medicine

## 2014-02-28 ENCOUNTER — Ambulatory Visit (INDEPENDENT_AMBULATORY_CARE_PROVIDER_SITE_OTHER): Payer: Medicare Other | Admitting: Sports Medicine

## 2014-02-28 VITALS — BP 116/70 | Ht 68.0 in | Wt 145.0 lb

## 2014-02-28 DIAGNOSIS — S39012A Strain of muscle, fascia and tendon of lower back, initial encounter: Secondary | ICD-10-CM

## 2014-02-28 DIAGNOSIS — S161XXA Strain of muscle, fascia and tendon at neck level, initial encounter: Secondary | ICD-10-CM

## 2014-02-28 DIAGNOSIS — S161XXD Strain of muscle, fascia and tendon at neck level, subsequent encounter: Secondary | ICD-10-CM | POA: Diagnosis not present
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
## 2014-03-13 ENCOUNTER — Ambulatory Visit: Payer: Medicare Other | Attending: Sports Medicine | Admitting: Physical Therapy

## 2014-03-13 ENCOUNTER — Encounter: Payer: Self-pay | Admitting: Physical Therapy

## 2014-03-13 DIAGNOSIS — M545 Low back pain, unspecified: Secondary | ICD-10-CM

## 2014-03-13 DIAGNOSIS — M542 Cervicalgia: Secondary | ICD-10-CM
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
## 2014-03-13 NOTE — Patient Instructions (Signed)
AROM: Neck Rotation   Turn head slowly to look over one shoulder, then the other. Hold each position _20-30___ seconds. Repeat _3-5__ times per set. Do _1___ sets per session. Do _1-3__ sessions per day.  http://orth.exer.us/294   Copyright  VHI. All rights reserved.  AROM: Lateral Neck Flexion   Slowly tilt head toward one shoulder, then the other. Hold each position _20-30___ seconds. Repeat _3-5___ times per set. Do _1___ sets per session. Do _1-3___ sessions per day.  http://orth.exer.us/296   Copyright  VHI. All rights reserved.   Double Knee to Chest (Flexion)   Gently pull both knees toward chest. Feel stretch in lower back or buttock area. Breathing deeply, Hold _20-30___ seconds. Repeat _3-5___ times. Do _1-3___ sessions per day.  http://gt2.exer.us/227   Copyright  VHI. All rights reserved.   Knee to Chest (Flexion)   Pull knee toward chest. Feel stretch in lower back or buttock area. Breathing deeply, Hold _20-30___ seconds. Repeat with other knee. Repeat _3-5___ times. Do _1-3___ sessions per day.  http://gt2.exer.us/225   Copyright  VHI. All rights reserved.    Isometric Abdominal   Lying on back with knees bent, tighten stomach by pressing elbows down. Hold _5___ seconds. Repeat _10___ times per set. Do _1___ sets per session. Do _1-3___ sessions per day.  http://orth.exer.us/1086   Copyright  VHI. All rights reserved.   Lumbar Rotation: Caudal - Bilateral (Supine)   Feet and knees together, arms outstretched, rotate knees left, turning head in opposite direction, until stretch is felt. Hold 20-30____ seconds. Relax. Repeat _3-5___ times per set. Do _1___ sets per session. Do _1-3__ sessions per day.  http://orth.exer.us/1020   Copyright  VHI. All rights reserved.

## 2014-03-15 ENCOUNTER — Ambulatory Visit: Payer: Medicare Other | Admitting: Physical Therapy

## 2014-03-15 DIAGNOSIS — M542 Cervicalgia: Secondary | ICD-10-CM | POA: Diagnosis not present

## 2014-03-15 DIAGNOSIS — M545 Low back pain, unspecified: Secondary | ICD-10-CM

## 2014-03-15 NOTE — Therapy (Signed)
Physical Therapy Treatment  Patient Details  Name: Vincent Eaton MRN: [id number] Date of Birth: March 06, 1946  Encounter Date: 03/15/2014      PT End of Session - 03/15/14 0840    Visit Number 2   Number of Visits 16   Date for PT Re-Evaluation 05/12/14   PT Start Time 0807   PT Stop Time 0849   PT Time Calculation (min) 42 min   Activity Tolerance Patient tolerated treatment well   Behavior During Therapy [facility] for tasks assessed/performed      Past Medical History  Diagnosis Date  . Right ureteral stone   . History of kidney stones   . Ureterocele   . Type 2 diabetes mellitus   . GERD (gastroesophageal reflux disease)     WATCHES DIET  . Urgency of urination   . Hematuria   . Dysuria   . Wears glasses     Past Surgical History  Procedure Laterality Date  . Extracorporeal shock wave lithotripsy Right 10-17-2010  . Cystoscopy/retrograde/ureteroscopy Right 03/10/2013    Procedure: CYSTOSCOPY UNROOF URETEROCELE STONE EXTRACTION;  Surgeon: Irine Seal, MD;  Location: [facility];  Service: Urology;  Laterality: Right;    There were no vitals taken for this visit.  Visit Diagnosis:  Pain in neck  Bilateral low back pain without sciatica      Subjective Assessment - 03/15/14 0809    Symptoms Pt reports neck and back are worse today; reports walking 1.5 miles yesterday.  Did exercises and reports they seemed fairly easy but increased pain today.   Limitations House hold activities;Walking;Standing   How long can you stand comfortably? 30 min   How long can you walk comfortably? 20 min "a little uncomfortable"   Patient Stated Goals to decrease pain, improve mobility   Currently in Pain? Yes   Pain Score 8    Pain Location Neck   Pain Orientation Right;Left  L>R   Pain Descriptors / Indicators Shooting;Tightness   Pain Type Acute pain   Pain Onset 1 to 4 weeks ago   Pain Frequency Intermittent  shooting is intermittent; tightness is constant   Aggravating Factors  movement   Pain Relieving Factors medication   Effect of Pain on Daily Activities unable to return to recreational activities   Multiple Pain Sites Yes   Pain Score 8   Pain Type Acute pain   Pain Location Back   Pain Orientation Lower;Right;Left   Pain Descriptors / Indicators Sore;Shooting   Pain Frequency Constant   Pain Onset With Activity            [facility] Adult PT Treatment/Exercise - 03/15/14 0814    Exercises   Exercises Lumbar;Neck   Neck Exercises: Stretches   Upper Trapezius Stretch 2 reps;30 seconds  bil   Neck Stretch 2 reps;30 seconds  rotation bil   Lumbar Exercises: Stretches   Single Knee to Chest Stretch 2 reps;30 seconds  bil   Double Knee to Chest Stretch 2 reps;30 seconds   Lower Trunk Rotation 2 reps;30 seconds  bil   Lumbar Exercises: Aerobic   Stationary Bike NuStep Level 5, 4 extremities x 8 min   Lumbar Exercises: Supine   Ab Set 10 reps;5 seconds   Modalities   Modalities Electrical Stimulation;Moist Heat   Moist Heat Therapy   Number Minutes Moist Heat 10 Minutes   Moist Heat Location Other (comment)  neck and low back   Electrical Stimulation   Electrical Stimulation Location neck and low  back   Electrical Stimulation Action pre-mod   Electrical Stimulation Parameters per machine, up to 10 mA intensity   Electrical Stimulation Goals Pain          PT Education - 03/15/14 0840    Education provided No          PT Short Term Goals - 03/15/14 0842    PT SHORT TERM GOAL #1   Title independent with initial HEP   Time 4   Period Weeks   Status Achieved   PT SHORT TERM GOAL #2   Title report pain< 5/10 in neck and back for improved function   Time 4   Period Weeks   Status On-going   PT SHORT TERM GOAL #3   Title improve cervical sidebending by 5 degrees bil for improved function   Time 4   Period Weeks   Status On-going          PT Long Term Goals - 03/15/14 5621    PT LONG TERM GOAL #1   Title  verbalize understanding on posture/body mechanics to reduce risk of reinjury   Time 8   Period Weeks   Status On-going   PT LONG TERM GOAL #2   Title improve cervical rotation by 5 degrees bil for improved motion and ease with ADLs and driving.   Time 8   Period Weeks   Status On-going   PT LONG TERM GOAL #3   Title independent with advanced HEP   Time 8   Period Weeks   Status On-going   PT LONG TERM GOAL #4   Title report return to running at least 1 mile without increase in pain   Time 8   Period Weeks   Status On-going          Plan - 03/15/14 0840    Clinical Impression Statement Pt demonstrated HEP independently and reports compliance with HEP.  Pt reports increased pain overall, but decreased pain during exercises.  Will cont to benefit from PT to maximize functional mobility.   Pt will benefit from skilled therapeutic intervention in order to improve on the following deficits Decreased mobility;Decreased strength;Impaired perceived functional ability;Pain;Postural dysfunction;Decreased range of motion   Rehab Potential Good   PT Frequency 2x / week   PT Duration 8 weeks   PT Treatment/Interventions Cryotherapy;Electrical Stimulation;Ultrasound;Traction;Moist Heat;Functional mobility training;Neuromuscular re-education;Manual techniques;Passive range of motion;Therapeutic exercise;Therapeutic activities;Patient/family education   PT Next Visit Plan continue core stability, stretches, hip strengthening, modalities as needed   PT Home Exercise Plan perform as recommended   Consulted and Agree with Plan of Care Patient        Problem List Patient Active Problem List   Diagnosis Date Noted  . Hypersomnia 09/16/2013  . Abdominal pain 09/16/2013  . Ureteral stone 03/10/2013  . Ureterocele, congenital 03/10/2013                                              Faustino Congress K 03/15/2014, 8:52 AM

## 2014-03-22 ENCOUNTER — Encounter: Payer: Self-pay | Admitting: Physical Therapy

## 2014-03-22 ENCOUNTER — Ambulatory Visit: Payer: Medicare Other | Admitting: Physical Therapy

## 2014-03-22 DIAGNOSIS — M542 Cervicalgia: Secondary | ICD-10-CM

## 2014-03-22 DIAGNOSIS — M545 Low back pain, unspecified: Secondary | ICD-10-CM

## 2014-03-22 NOTE — Therapy (Signed)
Physical Therapy Treatment  Patient Details  Name: Vincent Eaton MRN: [id number] Date of Birth: 1945/08/29  Encounter Date: 03/22/2014      PT End of Session - 03/22/14 0927    Visit Number 3   Number of Visits 16   PT Start Time 0850   PT Stop Time 0943   PT Time Calculation (min) 53 min   Activity Tolerance Patient tolerated treatment well      Past Medical History  Diagnosis Date  . Right ureteral stone   . History of kidney stones   . Ureterocele   . Type 2 diabetes mellitus   . GERD (gastroesophageal reflux disease)     WATCHES DIET  . Urgency of urination   . Hematuria   . Dysuria   . Wears glasses     Past Surgical History  Procedure Laterality Date  . Extracorporeal shock wave lithotripsy Right 10-17-2010  . Cystoscopy/retrograde/ureteroscopy Right 03/10/2013    Procedure: CYSTOSCOPY UNROOF URETEROCELE STONE EXTRACTION;  Surgeon: Irine Seal, MD;  Location: [facility];  Service: Urology;  Laterality: Right;    There were no vitals taken for this visit.  Visit Diagnosis:  Pain in neck  Bilateral low back pain without sciatica      Subjective Assessment - 03/22/14 0855    Symptoms Pain increased today in neck   Limitations Standing;Walking;House hold activities   How long can you stand comfortably? 30 min   How long can you walk comfortably? 20 min with some discomfort   Diagnostic tests XR neg (they told me I had muscle spasm)   Pain Score 9    Pain Location Neck   Pain Orientation Right;Left   Pain Descriptors / Indicators Shooting;Tightness   Pain Type Acute pain   Pain Onset 1 to 4 weeks ago   Pain Frequency Intermittent          [facility] PT Assessment - 03/22/14 0924    Observation/Other Assessments   Observations increased tonicity in bilateral upper traps and cervicals, holds tension during manual therapy          [facility] Adult PT Treatment/Exercise - 03/22/14 0856    Neck Exercises: Machines for Strengthening    Other Machines for Strengthening NuStep level 2 for 5 min for warm up   Neck Exercises: Supine   Neck Retraction 10 reps;5 secs  OA nod with towel roll   Capital Flexion 10 reps;5 secs  into ball, did 1 set each with slight rotation   Electrical Stimulation   Electrical Stimulation Location neck   Electrical Stimulation Action pre-mod   Electrical Stimulation Parameters to tolerance   Electrical Stimulation Goals Pain   Manual Therapy   Manual Therapy Joint mobilization;Massage   Joint Mobilization --  lateral glides to middle and lowecervicals, manual traction    Massage soft tissue to cervicals and bilateral upper traps                Plan - 03/22/14 0928    Clinical Impression Statement No further goals met. Pt reports no improvement in pain or function.  HEP is not helping at this point.  Advised patient to try to relax, engage in deep breathing and walking as able, continue with gentle stretching.     Pt will benefit from skilled therapeutic intervention in order to improve on the following deficits Decreased range of motion;Difficulty walking;Decreased endurance;Decreased activity tolerance;Increased muscle spasms;Pain;Decreased mobility;Decreased strength   Rehab Potential Good   PT Treatment/Interventions Cryotherapy;Electrical Stimulation;Ultrasound;Traction;Moist Heat;Functional  mobility training;Neuromuscular re-education;Manual techniques;Passive range of motion;Therapeutic exercise;Therapeutic activities;Patient/family education   PT Next Visit Plan Lower back stretching and modalities for pain   PT Home Exercise Plan perform as recommended   Consulted and Agree with Plan of Care Patient        Problem List Patient Active Problem List   Diagnosis Date Noted  . Hypersomnia 09/16/2013  . Abdominal pain 09/16/2013  . Ureteral stone 03/10/2013  . Ureterocele, congenital 03/10/2013                                               Raeford Razor, MPT 03/22/2014, 9:32 AM

## 2014-03-24 ENCOUNTER — Ambulatory Visit: Payer: Medicare Other | Admitting: Physical Therapy

## 2014-03-24 ENCOUNTER — Encounter: Payer: Self-pay | Admitting: Physical Therapy

## 2014-03-24 DIAGNOSIS — M545 Low back pain, unspecified: Secondary | ICD-10-CM

## 2014-03-24 DIAGNOSIS — M542 Cervicalgia: Secondary | ICD-10-CM | POA: Diagnosis not present

## 2014-03-24 NOTE — Therapy (Signed)
Physical Therapy Treatment  Patient Details  Name: Vincent Eaton MRN: [id number] Date of Birth: 12/14/1945  Encounter Date: 03/24/2014      PT End of Session - 03/24/14 0931    Visit Number 4   Number of Visits 16   PT Start Time 9735   PT Stop Time 0943   PT Time Calculation (min) 53 min      Past Medical History  Diagnosis Date  . Right ureteral stone   . History of kidney stones   . Ureterocele   . Type 2 diabetes mellitus   . GERD (gastroesophageal reflux disease)     WATCHES DIET  . Urgency of urination   . Hematuria   . Dysuria   . Wears glasses     Past Surgical History  Procedure Laterality Date  . Extracorporeal shock wave lithotripsy Right 10-17-2010  . Cystoscopy/retrograde/ureteroscopy Right 03/10/2013    Procedure: CYSTOSCOPY UNROOF URETEROCELE STONE EXTRACTION;  Surgeon: Irine Seal, MD;  Location: [facility];  Service: Urology;  Laterality: Right;    There were no vitals taken for this visit.  Visit Diagnosis:  Bilateral low back pain without sciatica  Pain in neck      Subjective Assessment - 03/24/14 0923    Symptoms Pain not changed much, sitting is tough   Limitations Sitting;Standing;Walking;House hold activities   Currently in Pain? Yes   Pain Score 7    Pain Location Back   Pain Orientation Right;Left   Pain Descriptors / Indicators Tightness   Pain Type Acute pain   Pain Onset 1 to 4 weeks ago   Pain Frequency Intermittent   Multiple Pain Sites Yes   Pain Score 7   Pain Type Acute pain   Pain Location Neck   Pain Orientation Posterior;Left;Right   Pain Descriptors / Indicators Tightness   Pain Frequency Constant          [facility] PT Assessment - 03/24/14 0925    Posture/Postural Control   Posture/Postural Control Postural limitations   Postural Limitations --  Rt. scapular winging evident >than Rt. in closed chain.    Posture Comments quadruped and wall push up difficult.   Lt. elbow hurting. Unable to  maintain neutral spine in quadr          [facility] Adult PT Treatment/Exercise - 03/24/14 0926    Neck Exercises: Standing   Wall Push Ups --  static hold with 10 reps of small press out for scap. stab.    Lumbar Exercises: Stretches   Single Knee to Chest Stretch 3 reps   Double Knee to Chest Stretch 3 reps   Lower Trunk Rotation 5 reps   Lower Trunk Rotation Limitations with head turns   Pelvic Tilt --  x 10   Lumbar Exercises: Supine   Bridge 15 reps  articulating    Lumbar Exercises: Quadruped   Other Quadruped Lumbar Exercises neutral hold for proprioception, unable to progress beyongf this   Shoulder Exercises: Supine   Horizontal ABduction Strengthening;Both   Theraband Level (Shoulder Horizontal ABduction) Level 2 (Red)   Theraband Level (Shoulder External Rotation) Level 2 (Red)  x 20   Modalities   Modalities Electrical Stimulation  prone   Moist Heat Therapy   Number Minutes Moist Heat 15 Minutes   Moist Heat Location --  back   Electrical Stimulation   Electrical Stimulation Location --  neck and back          PT Education - 03/24/14 3299  Education provided Yes   Education Details HEP and relaxation   Person(s) Educated Patient   Methods Explanation;Demonstration;Tactile cues   Comprehension Verbalized understanding;Returned demonstration;Need further instruction              Plan - 03/24/14 0932    Clinical Impression Statement Patient progressing towards goals within limitaitons of pain.  Needs cueing to slow movements.     PT Next Visit Plan Continue with POC, stretching and re-try quadruped        Problem List Patient Active Problem List   Diagnosis Date Noted  . Hypersomnia 09/16/2013  . Abdominal pain 09/16/2013  . Ureteral stone 03/10/2013  . Ureterocele, congenital 03/10/2013       Raeford Razor, PT 03/24/2014 9:41 AM Phone: [phone] Fax: [phone]  03/24/2014, 9:40 AM

## 2014-03-24 NOTE — Patient Instructions (Signed)
Double Knee to Chest (Flexion)   Gently pull both knees toward chest. Feel stretch in lower back or buttock area. Breathing deeply, Hold ___30_ seconds. Repeat __3__ times. Do _2___ sessions per day.  http://gt2.exer.us/227   Copyright  VHI. All rights reserved.  Piriformis (Supine)   Cross legs, right on top. Gently pull other knee toward chest until stretch is felt in buttock/hip of top leg. Hold _30___ seconds. Repeat ___3_ times per set.  Do __2__ sessions per day.  http://orth.exer.us/676   Copyright  VHI. All rights reserved.  Lower Trunk Rotation Stretch   Keeping back flat and feet together, rotate knees to left side. Hold ___30_ seconds. Repeat __3_ times per set. . Do __2__ sessions per day.  http://orth.exer.us/122   Copyright  VHI. All rights reserved.    Hamstring Step 2   Left foot relaxed, knee straight, other leg bent, foot flat. Raise straight leg further upward to maximal range. Hold _30__ seconds. Relax leg completely down. Repeat ___ times.  Copyright  VHI. All rights reserved.  Side Twist, Kneeling   Kneel, buttocks on heels. Fold upper body forward from hips. Then reach to each side as far as possible, keeping chest low toward floor. Hold each position _30__ seconds. Repeat __3_ times per session. Do __2_ sessions per day.  Copyright  VHI. All rights reserved.

## 2014-03-28 ENCOUNTER — Ambulatory Visit: Payer: Medicare Other | Admitting: Physical Therapy

## 2014-03-28 ENCOUNTER — Encounter: Payer: Self-pay | Admitting: Sports Medicine

## 2014-03-28 ENCOUNTER — Ambulatory Visit (INDEPENDENT_AMBULATORY_CARE_PROVIDER_SITE_OTHER): Payer: Medicare Other | Admitting: Sports Medicine

## 2014-03-28 VITALS — BP 116/58 | Ht 68.0 in | Wt 145.0 lb

## 2014-03-28 DIAGNOSIS — M545 Low back pain, unspecified: Secondary | ICD-10-CM

## 2014-03-28 DIAGNOSIS — S161XXD Strain of muscle, fascia and tendon at neck level, subsequent encounter: Secondary | ICD-10-CM

## 2014-03-28 DIAGNOSIS — S39012D Strain of muscle, fascia and tendon of lower back, subsequent encounter: Secondary | ICD-10-CM

## 2014-03-28 DIAGNOSIS — M542 Cervicalgia: Secondary | ICD-10-CM

## 2014-03-28 NOTE — Therapy (Signed)
Physical Therapy Treatment  Patient Details  Name: Vincent Eaton MRN: [id number] Date of Birth: [birth year]  Encounter Date: 03/28/2014      PT End of Session - 03/28/14 1238    Visit Number 5   Number of Visits 16   Date for PT Re-Evaluation 05/12/14   PT Start Time 4193   PT Stop Time 1230   PT Time Calculation (min) 42 min   Activity Tolerance Patient tolerated treatment well   Behavior During Therapy [facility] for tasks assessed/performed      Past Medical History  Diagnosis Date  . Right ureteral stone   . History of kidney stones   . Ureterocele   . Type 2 diabetes mellitus   . GERD (gastroesophageal reflux disease)     WATCHES DIET  . Urgency of urination   . Hematuria   . Dysuria   . Wears glasses     Past Surgical History  Procedure Laterality Date  . Extracorporeal shock wave lithotripsy Right 10-17-2010  . Cystoscopy/retrograde/ureteroscopy Right 03/10/2013    Procedure: CYSTOSCOPY UNROOF URETEROCELE STONE EXTRACTION;  Surgeon: Irine Seal, MD;  Location: [facility];  Service: Urology;  Laterality: Right;    There were no vitals taken for this visit.  Visit Diagnosis:  Bilateral low back pain without sciatica  Pain in neck      Subjective Assessment - 03/28/14 1151    Symptoms Feels the same, felt that manual helped neck last week.   Limitations Sitting;Standing;Walking;House hold activities   How long can you sit comfortably? "a little longer" (than standing)   How long can you stand comfortably? 20-30 min   How long can you walk comfortably? 20 min with some discomfort   Diagnostic tests XR neg (they told me I had muscle spasm)   Patient Stated Goals to decrease pain, improve mobility   Currently in Pain? Yes   Pain Score 5    Pain Location Back   Pain Orientation Left;Right   Pain Descriptors / Indicators Tightness   Pain Type Acute pain   Pain Onset 1 to 4 weeks ago   Pain Frequency Intermittent   Aggravating Factors   movement   Pain Relieving Factors medication   Effect of Pain on Daily Activities unable to return to recreational activities   Multiple Pain Sites Yes   Pain Score 7   Pain Type Acute pain   Pain Location Neck   Pain Orientation Right;Left;Posterior   Pain Descriptors / Indicators Tightness   Pain Frequency Constant   Pain Onset With Activity            [facility] Adult PT Treatment/Exercise - 03/28/14 1154    Neck Exercises: Theraband   Scapula Retraction 15 reps;Red   Other Theraband Exercises shoulder protraction x 15 reps bil with red theraband   Lumbar Exercises: Stretches   Passive Hamstring Stretch 3 reps;30 seconds   Single Knee to Chest Stretch 3 reps;30 seconds  bil   Lower Trunk Rotation 3 reps;30 seconds   Lumbar Exercises: Aerobic   Stationary Bike NuStep Level 3 x 8 min for warm up and strengthening   Lumbar Exercises: Quadruped   Madcat/Old Horse 5 reps   Madcat/Old Horse Limitations attempted x 5 reps; pt with L scapular winging and increased pain   Manual Therapy   Manual Therapy Joint mobilization;Massage   Joint Mobilization manual traction, suboccipital release and trigger point release to bil upper traps          PT  Education - 03/28/14 1238    Education provided No          PT Short Term Goals - 03/28/14 1239    PT SHORT TERM GOAL #2   Title report pain< 5/10 in neck and back for improved function   Time 4   Period Weeks   Status On-going   PT SHORT TERM GOAL #3   Title improve cervical sidebending by 5 degrees bil for improved function   Time 4   Period Weeks   Status On-going          PT Long Term Goals - 03/28/14 1239    PT LONG TERM GOAL #1   Title verbalize understanding on posture/body mechanics to reduce risk of reinjury   Time 8   Period Weeks   Status On-going   PT LONG TERM GOAL #2   Title improve cervical rotation by 5 degrees bil for improved motion and ease with ADLs and driving.   Time 8   Period Weeks   Status  On-going   PT LONG TERM GOAL #3   Title independent with advanced HEP   Time 8   Period Weeks   Status On-going   PT LONG TERM GOAL #4   Title report return to running at least 1 mile without increase in pain   Time 8   Period Weeks   Status On-going          Plan - 03/28/14 1238    Clinical Impression Statement Pt with limited progress towards goals and limited improvement in pain.  Will continue to benefit from PT to maximize functional mobility.   Pt will benefit from skilled therapeutic intervention in order to improve on the following deficits Decreased range of motion;Difficulty walking;Decreased endurance;Decreased activity tolerance;Increased muscle spasms;Pain;Decreased mobility;Decreased strength   Rehab Potential Good   PT Frequency 2x / week   PT Duration 8 weeks   PT Treatment/Interventions Cryotherapy;Electrical Stimulation;Ultrasound;Traction;Moist Heat;Functional mobility training;Neuromuscular re-education;Manual techniques;Passive range of motion;Therapeutic exercise;Therapeutic activities;Patient/family education   PT Next Visit Plan Continue with POC, stretching and re-try quadruped   PT Home Exercise Plan perform as recommended   Consulted and Agree with Plan of Care Patient        Problem List Patient Active Problem List   Diagnosis Date Noted  . Hypersomnia 09/16/2013  . Abdominal pain 09/16/2013  . Ureteral stone 03/10/2013  . Ureterocele, congenital 03/10/2013                                           Laureen Abrahams, PT, DPT 03/28/2014 12:41 PM [address] [phone] (office) [phone] (fax)
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
## 2014-03-29 ENCOUNTER — Telehealth: Payer: Self-pay | Admitting: *Deleted

## 2014-03-29 ENCOUNTER — Ambulatory Visit: Payer: Medicare Other | Admitting: Physical Therapy

## 2014-03-29 DIAGNOSIS — M542 Cervicalgia: Secondary | ICD-10-CM

## 2014-03-29 DIAGNOSIS — M545 Low back pain, unspecified: Secondary | ICD-10-CM

## 2014-03-29 NOTE — Therapy (Signed)
Physical Therapy Treatment  Patient Details  Name: Vincent Eaton MRN: [id number] Date of Birth: [birth year]  Encounter Date: 03/29/2014      PT End of Session - 03/29/14 0919    Visit Number 6   Number of Visits 16   Date for PT Re-Evaluation 05/12/14   PT Start Time 0850   PT Stop Time 0934   PT Time Calculation (min) 44 min   Activity Tolerance Patient tolerated treatment well   Behavior During Therapy [facility] for tasks assessed/performed      Past Medical History  Diagnosis Date  . Right ureteral stone   . History of kidney stones   . Ureterocele   . Type 2 diabetes mellitus   . GERD (gastroesophageal reflux disease)     WATCHES DIET  . Urgency of urination   . Hematuria   . Dysuria   . Wears glasses     Past Surgical History  Procedure Laterality Date  . Extracorporeal shock wave lithotripsy Right 10-17-2010  . Cystoscopy/retrograde/ureteroscopy Right 03/10/2013    Procedure: CYSTOSCOPY UNROOF URETEROCELE STONE EXTRACTION;  Surgeon: Irine Seal, MD;  Location: [facility];  Service: Urology;  Laterality: Right;    There were no vitals taken for this visit.  Visit Diagnosis:  Bilateral low back pain without sciatica  Pain in neck      Subjective Assessment - 03/29/14 0854    Symptoms Feels the same; had a lot of pain last night; couldn't sleep.   Limitations Sitting;Standing;Walking;House hold activities   How long can you stand comfortably? 20-30 min   How long can you walk comfortably? 20 min with some discomfort   Diagnostic tests XR neg (they told me I had muscle spasm)   Patient Stated Goals to decrease pain, improve mobility   Currently in Pain? Yes   Pain Score 5    Pain Location Back   Pain Orientation Right;Left   Pain Descriptors / Indicators Tightness   Pain Type Acute pain   Pain Onset 1 to 4 weeks ago   Pain Frequency Intermittent   Aggravating Factors  couldn't sit last night due to pain   Pain Relieving Factors  medication   Multiple Pain Sites Yes   Pain Score 5  "about the same as yesterday" yesterday 7/10   Pain Type Acute pain   Pain Location Neck   Pain Orientation Right;Posterior   Pain Descriptors / Indicators Tightness   Pain Frequency Constant   Pain Onset With Activity            [facility] Adult PT Treatment/Exercise - 03/29/14 0859    Neck Exercises: Theraband   Scapula Retraction 15 reps;Red   Horizontal ABduction 15 reps;Red   Other Theraband Exercises shoulder protraction x 15 reps bil with red theraband   Lumbar Exercises: Aerobic   Stationary Bike NuStep Level 3 x 6 min for warm up and strengthening   Elliptical X 5 min; resistance 3; incline 2   Modalities   Modalities Electrical Stimulation;Moist Heat   Moist Heat Therapy   Number Minutes Moist Heat 15 Minutes   Moist Heat Location Other (comment)  lumbar and cervical spine   Electrical Stimulation   Electrical Stimulation Location lumbar/cervical spine   Electrical Stimulation Action premod   Electrical Stimulation Parameters to tolerance   Electrical Stimulation Goals Pain          PT Education - 03/29/14 0919    Education provided No  PT Short Term Goals - 03/29/14 0920    PT SHORT TERM GOAL #2   Title report pain< 5/10 in neck and back for improved function   Time 4   Period Weeks   Status On-going   PT SHORT TERM GOAL #3   Title improve cervical sidebending by 5 degrees bil for improved function   Period Weeks   Status On-going          PT Long Term Goals - 03/29/14 0920    PT LONG TERM GOAL #1   Title verbalize understanding on posture/body mechanics to reduce risk of reinjury   Time 8   Period Weeks   Status On-going   PT LONG TERM GOAL #2   Title improve cervical rotation by 5 degrees bil for improved motion and ease with ADLs and driving.   Time 8   Period Weeks   Status On-going   PT LONG TERM GOAL #3   Title independent with advanced HEP   Time 8   Period Weeks    Status On-going   PT LONG TERM GOAL #4   Title report return to running at least 1 mile without increase in pain   Time 8   Period Weeks   Status On-going          Plan - 03/29/14 0919    Clinical Impression Statement Pt reports no improvement in pain from yesterday.  Plan to assess STGs next visit and progress as able.  If pt continues to report limited progress may need to consider d/c   Pt will benefit from skilled therapeutic intervention in order to improve on the following deficits Decreased range of motion;Difficulty walking;Decreased endurance;Decreased activity tolerance;Increased muscle spasms;Pain;Decreased mobility;Decreased strength   Rehab Potential Good   PT Frequency 2x / week   PT Duration 8 weeks   PT Treatment/Interventions Cryotherapy;Electrical Stimulation;Ultrasound;Traction;Moist Heat;Functional mobility training;Neuromuscular re-education;Manual techniques;Passive range of motion;Therapeutic exercise;Therapeutic activities;Patient/family education   PT Next Visit Plan Continue with POC, stretching; scapular stabilizer exercises   PT Home Exercise Plan perform as recommended   Consulted and Agree with Plan of Care Patient        Problem List Patient Active Problem List   Diagnosis Date Noted  . Hypersomnia 09/16/2013  . Abdominal pain 09/16/2013  . Ureteral stone 03/10/2013  . Ureterocele, congenital 03/10/2013                                           Laureen Abrahams, PT, DPT 03/29/2014 9:36 AM [address] [phone] (office) [phone] (fax)

## 2014-03-29 NOTE — Telephone Encounter (Signed)
appts made and printed...td 

## 2014-04-04 ENCOUNTER — Ambulatory Visit: Payer: Medicare Other | Attending: Sports Medicine | Admitting: Physical Therapy

## 2014-04-04 DIAGNOSIS — M545 Low back pain, unspecified: Secondary | ICD-10-CM

## 2014-04-04 DIAGNOSIS — M542 Cervicalgia: Secondary | ICD-10-CM | POA: Insufficient documentation

## 2014-04-04 NOTE — Therapy (Signed)
Physical Therapy Treatment  Patient Details  Name: GALDINO HINCHMAN MRN: [id number] Date of Birth: 1945/12/20  Encounter Date: 04/04/2014      PT End of Session - 04/04/14 1212    Visit Number 8      Past Medical History  Diagnosis Date  . Right ureteral stone   . History of kidney stones   . Ureterocele   . Type 2 diabetes mellitus   . GERD (gastroesophageal reflux disease)     WATCHES DIET  . Urgency of urination   . Hematuria   . Dysuria   . Wears glasses     Past Surgical History  Procedure Laterality Date  . Extracorporeal shock wave lithotripsy Right 10-17-2010  . Cystoscopy/retrograde/ureteroscopy Right 03/10/2013    Procedure: CYSTOSCOPY UNROOF URETEROCELE STONE EXTRACTION;  Surgeon: Irine Seal, MD;  Location: [facility];  Service: Urology;  Laterality: Right;    There were no vitals taken for this visit.  Visit Diagnosis:  Bilateral low back pain without sciatica  Pain in neck      Subjective Assessment - 04/04/14 0857    Symptoms Saw the MD last week, he said to continue with PT.  I have good days and bad.  I raked this weekend and that made my pain increase.  Under alot of stress    Pertinent History May have history of degenerative changes in spine, will get copies of XR from Dr. Shelia Media.     Limitations Sitting;Standing;Walking;House hold activities   How long can you sit comfortably? "a little longer" (than standing)   How long can you stand comfortably? 20-30 min   How long can you walk comfortably? 20 min with some discomfort   Currently in Pain? Yes   Pain Score 6    Pain Location Back   Pain Orientation Right;Left   Pain Descriptors / Indicators Tightness   Pain Type Chronic pain   Pain Onset More than a month ago   Pain Frequency Intermittent   Pain Relieving Factors meds, heat, stretching (in steamroom) and massage   Multiple Pain Sites Yes   Pain Score 6   Pain Type Chronic pain   Pain Location Neck   Pain Orientation  Posterior   Pain Descriptors / Indicators Tightness   Pain Frequency Intermittent   Pain Onset On-going            [facility] Adult PT Treatment/Exercise - 04/04/14 0907    Neck Exercises: Supine   Cervical Rotation Both;10 reps   Shoulder Flexion --  alternating with c stab   Shoulder ABduction --  with C-stab   Other Supine Exercise Supine chin tuck into ball.(nod), rotation.    Lumbar Exercises: Stretches   Single Knee to Chest Stretch 3 reps;30 seconds   Single Knee to Chest Stretch Limitations --  not feeling much stretch   Lower Trunk Rotation --  x10 and hips flexed   Pelvic Tilt --  x10 with coordinated breathing   Modalities   Modalities Electrical Stimulation;Moist Heat   Moist Heat Therapy   Number Minutes Moist Heat 15 Minutes   Moist Heat Location Other (comment)  back   Electrical Stimulation   Electrical Stimulation Location lumbar and upper back   Electrical Stimulation Action premod   Electrical Stimulation Parameters to tol.    Electrical Stimulation Goals Pain   Pelvic Floor Biofeedback   Number of Minutes --  15 min   Manual Therapy   Manual Therapy Myofascial release   Myofascial Release  neck post cervicals    Self Care: Stress and pain, breathing, stress mgmt. , HEP      PT Education - 04/04/14 0930    Education provided Yes   Education Details Stress and pain   Person(s) Educated Patient   Methods Explanation   Comprehension Verbalized understanding          PT Short Term Goals - 04/04/14 1217    PT SHORT TERM GOAL #2   Title report pain< 5/10 in neck and back for improved function   Baseline 1-2 days per week.    Time 4   Period Weeks   Status On-going            Plan - 04/04/14 0931    Clinical Impression Statement Patient with limited progress.  Some days he reports are better than others.   He is under a great deal of stress with his son and feels this may be interfering with his health.  He does respond favorably to  treatment temporarily.  He was urged to continue for a few more visits, be consistent with HEP and  try to relax as able.      PT Next Visit Plan Consider traction? Give T band for scap unattached HEP. MEASURE C-ROM   Consulted and Agree with Plan of Care Patient        Raeford Razor, PT 04/04/2014 12:20 PM Phone: [phone] Fax: [phone]    Adryanna Friedt 04/04/2014, 12:19 PM

## 2014-04-10 ENCOUNTER — Ambulatory Visit: Payer: Medicare Other | Admitting: Physical Therapy

## 2014-04-10 DIAGNOSIS — M545 Low back pain, unspecified: Secondary | ICD-10-CM

## 2014-04-10 DIAGNOSIS — M542 Cervicalgia: Secondary | ICD-10-CM | POA: Diagnosis not present

## 2014-04-10 NOTE — Therapy (Signed)
[facility] [address] [place], [place], [address] Phone: [phone]   Fax:  [phone]  Physical Therapy Treatment  Patient Details  Name: Vincent Eaton MRN: [id number] Date of Birth: [birth year]  Encounter Date: 04/10/2014      PT End of Session - 04/10/14 1058    Visit Number 9   Number of Visits 16   Date for PT Re-Evaluation 05/12/14   PT Start Time 1020   PT Stop Time 1058   PT Time Calculation (min) 38 min   Activity Tolerance Patient tolerated treatment well   Behavior During Therapy [facility] for tasks assessed/performed      Past Medical History  Diagnosis Date  . Right ureteral stone   . History of kidney stones   . Ureterocele   . Type 2 diabetes mellitus   . GERD (gastroesophageal reflux disease)     WATCHES DIET  . Urgency of urination   . Hematuria   . Dysuria   . Wears glasses     Past Surgical History  Procedure Laterality Date  . Extracorporeal shock wave lithotripsy Right 10-17-2010  . Cystoscopy/retrograde/ureteroscopy Right 03/10/2013    Procedure: CYSTOSCOPY UNROOF URETEROCELE STONE EXTRACTION;  Surgeon: Irine Seal, MD;  Location: [facility];  Service: Urology;  Laterality: Right;    There were no vitals taken for this visit.  Visit Diagnosis:  Bilateral low back pain without sciatica  Pain in neck      Subjective Assessment - 04/10/14 1026    Symptoms Feels better when leaving therapy however relief short term and symptoms return.   Pertinent History May have history of degenerative changes in spine, will get copies of XR from Dr. Shelia Media.     Limitations Sitting;Standing;Walking;House hold activities   How long can you sit comfortably? 1 hour, 15 min   How long can you stand comfortably? 45 min   How long can you walk comfortably? "I haven't walked lately"   Diagnostic tests XR neg (they told me I had muscle spasm)   Patient Stated Goals to decrease pain, improve mobility   Currently in Pain? Yes   Pain Score 5    Pain Location Back   Pain Orientation Right;Left   Pain Descriptors / Indicators Tightness   Pain Type Chronic pain   Pain Onset More than a month ago   Pain Frequency Intermittent   Aggravating Factors  movement   Pain Relieving Factors meds, heat, stretching and massage   Effect of Pain on Daily Activities difficulty sleeping   Pain Score 6   Pain Type Chronic pain   Pain Location Neck   Pain Orientation Posterior   Pain Descriptors / Indicators Tightness   Pain Frequency Intermittent   Pain Onset On-going          [facility] PT Assessment - 04/10/14 1037    AROM   Cervical Flexion 44   Cervical Extension 50   Cervical - Right Side Bend 15   Cervical - Left Side Bend 17   Cervical - Right Rotation 37   Cervical - Left Rotation 43          [facility] Adult PT Treatment/Exercise - 04/10/14 1029    Neck Exercises: Stretches   Upper Trapezius Stretch 2 reps;30 seconds   Levator Stretch 2 reps;30 seconds   Neck Exercises: Supine   Neck Retraction 10 reps;5 secs   Shoulder Flexion Both;10 reps;Other (comment)  with cervical retraction and yellow t band   Shoulder ABduction 10  reps;Other (comment)  alt horizontal abdct with yellow t band   Lumbar Exercises: Aerobic   Stationary Bike NuStep Level 4, 4 extemities x 8 min   Manual Therapy   Manual Therapy Myofascial release   Myofascial Release manual sub occipital release and manual cervical traction to neck; response: "better"          PT Education - 04/10/14 1057    Education provided Yes   Education Details current POC an limited progress, d/c v/s continue   Person(s) Educated Patient   Methods Explanation   Comprehension Verbalized understanding          PT Short Term Goals - 04/10/14 1059    PT SHORT TERM GOAL #1   Title independent with initial HEP   Time 4   Period Weeks   Status Achieved   PT SHORT TERM GOAL #2   Title report pain< 5/10 in neck and back for  improved function   Baseline 1-2 days per week.    Time 4   Period Weeks   Status On-going   PT SHORT TERM GOAL #3   Title improve cervical sidebending by 5 degrees bil for improved function   Time 4   Period Weeks   Status On-going          PT Long Term Goals - 04/10/14 1059    PT LONG TERM GOAL #1   Title verbalize understanding on posture/body mechanics to reduce risk of reinjury   Time 8   Period Weeks   Status On-going   PT LONG TERM GOAL #2   Title improve cervical rotation by 5 degrees bil for improved motion and ease with ADLs and driving.   Time 8   Period Weeks   Status On-going   PT LONG TERM GOAL #3   Title independent with advanced HEP   Time 8   Period Weeks   Status On-going   PT LONG TERM GOAL #4   Title report return to running at least 1 mile without increase in pain   Time 8   Period Weeks   Status On-going          Plan - 04/10/14 1058    Clinical Impression Statement Pt continues to report limited progress with PT.  Anticipate transition to home and community fitness next visit.   PT Next Visit Plan assess goals, gcode, foto, ?d/c v/c continue 1-2 more sessions   PT Home Exercise Plan perform as recommended   Consulted and Agree with Plan of Care Patient                               Problem List Patient Active Problem List   Diagnosis Date Noted  . Hypersomnia 09/16/2013  . Abdominal pain 09/16/2013  . Ureteral stone 03/10/2013  . Ureterocele, congenital 03/10/2013     Laureen Abrahams, PT, DPT 04/10/2014 11:01 AM  [facility] [address] [place] [address]  [phone] (office) [phone] (fax)

## 2014-04-12 ENCOUNTER — Ambulatory Visit: Payer: Medicare Other | Admitting: Physical Therapy

## 2014-04-12 DIAGNOSIS — M542 Cervicalgia: Secondary | ICD-10-CM | POA: Diagnosis not present

## 2014-04-12 DIAGNOSIS — M545 Low back pain, unspecified: Secondary | ICD-10-CM

## 2014-04-12 NOTE — Patient Instructions (Signed)
HEP review and alternatives for continued therapeutic massage

## 2014-04-12 NOTE — Therapy (Signed)
[facility] [address] [place], [place], [address] Phone: [phone]   Fax:  [phone]  Physical Therapy Treatment  Patient Details  Name: JAMIEON LANNEN MRN: [id number] Date of Birth: 1945/09/02  Encounter Date: 04/12/2014      PT End of Session - 04/12/14 0930    Visit Number 10   PT Start Time 0852   PT Stop Time 0940   PT Time Calculation (min) 48 min      Past Medical History  Diagnosis Date  . Right ureteral stone   . History of kidney stones   . Ureterocele   . Type 2 diabetes mellitus   . GERD (gastroesophageal reflux disease)     WATCHES DIET  . Urgency of urination   . Hematuria   . Dysuria   . Wears glasses     Past Surgical History  Procedure Laterality Date  . Extracorporeal shock wave lithotripsy Right 10-17-2010  . Cystoscopy/retrograde/ureteroscopy Right 03/10/2013    Procedure: CYSTOSCOPY UNROOF URETEROCELE STONE EXTRACTION;  Surgeon: Irine Seal, MD;  Location: [facility];  Service: Urology;  Laterality: Right;    There were no vitals taken for this visit.  Visit Diagnosis:  Bilateral low back pain without sciatica  Pain in neck      Subjective Assessment - 04/12/14 0855    Symptoms Does report feeling better, however realizes his progress has been limited. Willing to DC and try other things.    Currently in Pain? Yes   Pain Score 4    Pain Location Back   Pain Orientation Right;Left   Pain Descriptors / Indicators Tightness   Pain Onset More than a month ago   Multiple Pain Sites Yes   Pain Score 4   Pain Type Chronic pain   Pain Location Neck   Pain Orientation Posterior   Pain Radiating Towards Was down into L UE the other day   Pain Descriptors / Indicators Tightness   Pain Frequency Intermittent          [facility] PT Assessment - 04/12/14 0900    AROM   Lumbar Flexion WNL  done in sitting due to pain   Lumbar Extension 25%  pain   Lumbar - Right Side Bend 25%   Lumbar - Left Side Bend 25%          [facility] Adult PT Treatment/Exercise - 04/12/14 0927    Neck Exercises: Stretches   Upper Trapezius Stretch 2 reps;30 seconds   Levator Stretch 2 reps;30 seconds   Other Neck Stretches --  rotation and extension x 2 each bilateral 30 sec   Moist Heat Therapy   Number Minutes Moist Heat 15 Minutes   Moist Heat Location Other (comment)  neck and back   Electrical Stimulation   Electrical Stimulation Location --  neck and lumbar   Electrical Stimulation Parameters --  to tol   Electrical Stimulation Goals Pain   Manual Therapy   Manual Therapy Myofascial release;Passive ROM   Massage --  cervicals, upper trap   Myofascial Release --  cervicals and upper trap   Passive ROM C-spine          PT Education - 04/12/14 0930    Education Details DC, HEP, massage, home TENS   Person(s) Educated Patient   Methods Explanation;Demonstration   Comprehension Verbalized understanding;Returned demonstration            PT Long Term Goals - 04/12/14 0931    PT LONG TERM GOAL #1  Title verbalize understanding on posture/body mechanics to reduce risk of reinjury   Status Achieved   PT LONG TERM GOAL #2   Title improve cervical rotation by 5 degrees bil for improved motion and ease with ADLs and driving.   Status Not Met   PT LONG TERM GOAL #3   Title independent with advanced HEP   Status Achieved   PT LONG TERM GOAL #4   Title report return to running at least 1 mile without increase in pain   Status Not Met          Plan - 05/05/14 0930    Clinical Impression Statement Agreeable to DC. Measures taken.     PT Next Visit Plan DC   Consulted and Agree with Plan of Care Patient          G-Codes - May 05, 2014 0953    Functional Assessment Tool Used FOTO   Functional Limitation Mobility: Walking and moving around   Mobility: Walking and Moving Around Goal Status [phone]) At least 20 percent but less than 40 percent impaired, limited or  restricted   Mobility: Walking and Moving Around Discharge Status [id number]) At least 40 percent but less than 60 percent impaired, limited or restricted  same, 48% limited     PHYSICAL THERAPY DISCHARGE SUMMARY  Visits from Start of Care: 10  Current functional level related to goals / functional outcomes: See above for goals   Remaining deficits: Pain and stiffness in upper back, neck and low back.    Education / Equipment: HEP, posture, massage, Home TENS  Plan: Patient agrees to discharge.  Patient goals were partially met. Patient is being discharged due to lack of progress.  ?????     Raeford Razor, PT 2014/05/05 9:58 AM Phone: [phone] Fax: [phone]    Problem List Patient Active Problem List   Diagnosis Date Noted  . Hypersomnia 09/16/2013  . Abdominal pain 09/16/2013  . Ureteral stone 03/10/2013  . Ureterocele, congenital 03/10/2013    Elizzie Westergard 05/05/2014, 9:58 AM

## 2014-04-24 DIAGNOSIS — M542 Cervicalgia: Secondary | ICD-10-CM | POA: Diagnosis not present

## 2014-04-24 DIAGNOSIS — E1139 Type 2 diabetes mellitus with other diabetic ophthalmic complication: Secondary | ICD-10-CM | POA: Diagnosis not present

## 2014-04-24 DIAGNOSIS — Z86010 Personal history of colonic polyps: Secondary | ICD-10-CM | POA: Diagnosis not present

## 2014-05-01 ENCOUNTER — Encounter: Payer: Self-pay | Admitting: Sports Medicine

## 2014-05-01 ENCOUNTER — Ambulatory Visit (INDEPENDENT_AMBULATORY_CARE_PROVIDER_SITE_OTHER): Payer: Medicare Other | Admitting: Sports Medicine

## 2014-05-01 VITALS — BP 111/68 | HR 66 | Ht 68.0 in | Wt 145.0 lb

## 2014-05-01 DIAGNOSIS — M5030 Other cervical disc degeneration, unspecified cervical region: Secondary | ICD-10-CM | POA: Diagnosis not present
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
## 2014-05-10 ENCOUNTER — Other Ambulatory Visit: Payer: Self-pay | Admitting: *Deleted

## 2014-05-10 DIAGNOSIS — M545 Low back pain, unspecified: Secondary | ICD-10-CM

## 2014-05-10 DIAGNOSIS — M542 Cervicalgia: Secondary | ICD-10-CM

## 2014-05-30 ENCOUNTER — Ambulatory Visit: Payer: Medicare Other | Admitting: Sports Medicine

## 2014-06-01 ENCOUNTER — Encounter: Payer: Self-pay | Admitting: Sports Medicine

## 2014-06-01 ENCOUNTER — Ambulatory Visit (INDEPENDENT_AMBULATORY_CARE_PROVIDER_SITE_OTHER): Payer: Medicare Other | Admitting: Sports Medicine

## 2014-06-01 VITALS — BP 112/71 | Ht 68.0 in | Wt 145.0 lb

## 2014-06-01 DIAGNOSIS — M5030 Other cervical disc degeneration, unspecified cervical region: Secondary | ICD-10-CM | POA: Diagnosis not present

## 2014-06-01 DIAGNOSIS — S161XXD Strain of muscle, fascia and tendon at neck level, subsequent encounter: Secondary | ICD-10-CM | POA: Diagnosis not present

## 2014-06-01 NOTE — Progress Notes (Signed)
Patient ID: Vincent Eaton, male   DOB: July 12, 1945, 68 y.o.   MRN: [id number]  Patient comes in today for follow-up on neck pain. Still struggling with pain. He has yet to try acupuncture. Unfortunately, integrative therapies does not take his insurance. He is now looking around to see who is the least expensive. We had talked previously about a cervical spine MRI in anticipation of trying some cervical ESI's or facet injections. X-rays of his cervical spine show both cervical degenerative disc disease and some facet arthropathy I once again suggested this but the patient would like to try acupuncture first. Therefore, I will wait to hear back from him after he has tried 2 or 3 acupuncture sessions.  Time spent with the patient was then minutes with 100% of the time spent in face-to-face consultation discussing his diagnosis and treatment options.

## 2014-06-21 ENCOUNTER — Encounter: Payer: Self-pay | Admitting: Sports Medicine

## 2014-06-22 DIAGNOSIS — M9903 Segmental and somatic dysfunction of lumbar region: Secondary | ICD-10-CM | POA: Diagnosis not present

## 2014-06-22 DIAGNOSIS — S29012A Strain of muscle and tendon of back wall of thorax, initial encounter: Secondary | ICD-10-CM | POA: Diagnosis not present

## 2014-06-22 DIAGNOSIS — S138XXA Sprain of joints and ligaments of other parts of neck, initial encounter: Secondary | ICD-10-CM | POA: Diagnosis not present

## 2014-06-22 DIAGNOSIS — M9902 Segmental and somatic dysfunction of thoracic region: Secondary | ICD-10-CM | POA: Diagnosis not present

## 2014-06-22 DIAGNOSIS — M9901 Segmental and somatic dysfunction of cervical region: Secondary | ICD-10-CM | POA: Diagnosis not present

## 2014-06-22 DIAGNOSIS — M47817 Spondylosis without myelopathy or radiculopathy, lumbosacral region: Secondary | ICD-10-CM | POA: Diagnosis not present

## 2014-06-26 DIAGNOSIS — E139 Other specified diabetes mellitus without complications: Secondary | ICD-10-CM | POA: Diagnosis not present

## 2014-06-26 DIAGNOSIS — R140 Abdominal distension (gaseous): Secondary | ICD-10-CM | POA: Diagnosis not present

## 2014-06-26 DIAGNOSIS — R1084 Generalized abdominal pain: Secondary | ICD-10-CM | POA: Diagnosis not present

## 2014-06-30 ENCOUNTER — Encounter: Payer: Self-pay | Admitting: Internal Medicine

## 2014-07-04 DIAGNOSIS — S29012A Strain of muscle and tendon of back wall of thorax, initial encounter: Secondary | ICD-10-CM | POA: Diagnosis not present

## 2014-07-04 DIAGNOSIS — M9903 Segmental and somatic dysfunction of lumbar region: Secondary | ICD-10-CM | POA: Diagnosis not present

## 2014-07-04 DIAGNOSIS — M9901 Segmental and somatic dysfunction of cervical region: Secondary | ICD-10-CM | POA: Diagnosis not present

## 2014-07-04 DIAGNOSIS — M9902 Segmental and somatic dysfunction of thoracic region: Secondary | ICD-10-CM | POA: Diagnosis not present

## 2014-07-04 DIAGNOSIS — M47817 Spondylosis without myelopathy or radiculopathy, lumbosacral region: Secondary | ICD-10-CM | POA: Diagnosis not present

## 2014-07-04 DIAGNOSIS — S138XXA Sprain of joints and ligaments of other parts of neck, initial encounter: Secondary | ICD-10-CM | POA: Diagnosis not present

## 2014-07-05 DIAGNOSIS — R1011 Right upper quadrant pain: Secondary | ICD-10-CM | POA: Diagnosis not present

## 2014-07-06 ENCOUNTER — Other Ambulatory Visit: Payer: Self-pay | Admitting: Internal Medicine

## 2014-07-06 DIAGNOSIS — M9903 Segmental and somatic dysfunction of lumbar region: Secondary | ICD-10-CM | POA: Diagnosis not present

## 2014-07-06 DIAGNOSIS — M9902 Segmental and somatic dysfunction of thoracic region: Secondary | ICD-10-CM | POA: Diagnosis not present

## 2014-07-06 DIAGNOSIS — M47817 Spondylosis without myelopathy or radiculopathy, lumbosacral region: Secondary | ICD-10-CM | POA: Diagnosis not present

## 2014-07-06 DIAGNOSIS — S29012A Strain of muscle and tendon of back wall of thorax, initial encounter: Secondary | ICD-10-CM | POA: Diagnosis not present

## 2014-07-06 DIAGNOSIS — S138XXA Sprain of joints and ligaments of other parts of neck, initial encounter: Secondary | ICD-10-CM | POA: Diagnosis not present

## 2014-07-06 DIAGNOSIS — M9901 Segmental and somatic dysfunction of cervical region: Secondary | ICD-10-CM | POA: Diagnosis not present

## 2014-07-06 DIAGNOSIS — R1011 Right upper quadrant pain: Secondary | ICD-10-CM

## 2014-07-07 ENCOUNTER — Ambulatory Visit (HOSPITAL_COMMUNITY)
Admission: RE | Admit: 2014-07-07 | Discharge: 2014-07-07 | Disposition: A | Discharge status: Home / Self Care | Payer: Medicare Other | Source: Ambulatory Visit | Attending: Internal Medicine | Admitting: Internal Medicine

## 2014-07-07 DIAGNOSIS — R1011 Right upper quadrant pain: Secondary | ICD-10-CM | POA: Diagnosis not present

## 2014-07-07 DIAGNOSIS — D1803 Hemangioma of intra-abdominal structures: Secondary | ICD-10-CM | POA: Diagnosis not present

## 2014-07-07 DIAGNOSIS — M549 Dorsalgia, unspecified: Secondary | ICD-10-CM | POA: Insufficient documentation

## 2014-07-11 DIAGNOSIS — S29012A Strain of muscle and tendon of back wall of thorax, initial encounter: Secondary | ICD-10-CM | POA: Diagnosis not present

## 2014-07-11 DIAGNOSIS — M47817 Spondylosis without myelopathy or radiculopathy, lumbosacral region: Secondary | ICD-10-CM | POA: Diagnosis not present

## 2014-07-11 DIAGNOSIS — M9903 Segmental and somatic dysfunction of lumbar region: Secondary | ICD-10-CM | POA: Diagnosis not present

## 2014-07-11 DIAGNOSIS — M9902 Segmental and somatic dysfunction of thoracic region: Secondary | ICD-10-CM | POA: Diagnosis not present

## 2014-07-11 DIAGNOSIS — S138XXA Sprain of joints and ligaments of other parts of neck, initial encounter: Secondary | ICD-10-CM | POA: Diagnosis not present

## 2014-07-11 DIAGNOSIS — M9901 Segmental and somatic dysfunction of cervical region: Secondary | ICD-10-CM | POA: Diagnosis not present

## 2014-07-12 ENCOUNTER — Other Ambulatory Visit: Payer: BC Managed Care – PPO

## 2014-07-14 DIAGNOSIS — M9901 Segmental and somatic dysfunction of cervical region: Secondary | ICD-10-CM | POA: Diagnosis not present

## 2014-07-14 DIAGNOSIS — M47817 Spondylosis without myelopathy or radiculopathy, lumbosacral region: Secondary | ICD-10-CM | POA: Diagnosis not present

## 2014-07-14 DIAGNOSIS — S29012A Strain of muscle and tendon of back wall of thorax, initial encounter: Secondary | ICD-10-CM | POA: Diagnosis not present

## 2014-07-14 DIAGNOSIS — M47812 Spondylosis without myelopathy or radiculopathy, cervical region: Secondary | ICD-10-CM | POA: Diagnosis not present

## 2014-07-14 DIAGNOSIS — M9902 Segmental and somatic dysfunction of thoracic region: Secondary | ICD-10-CM | POA: Diagnosis not present

## 2014-07-14 DIAGNOSIS — M9903 Segmental and somatic dysfunction of lumbar region: Secondary | ICD-10-CM | POA: Diagnosis not present

## 2014-07-18 DIAGNOSIS — M9902 Segmental and somatic dysfunction of thoracic region: Secondary | ICD-10-CM | POA: Diagnosis not present

## 2014-07-18 DIAGNOSIS — M47812 Spondylosis without myelopathy or radiculopathy, cervical region: Secondary | ICD-10-CM | POA: Diagnosis not present

## 2014-07-18 DIAGNOSIS — M47817 Spondylosis without myelopathy or radiculopathy, lumbosacral region: Secondary | ICD-10-CM | POA: Diagnosis not present

## 2014-07-18 DIAGNOSIS — M9903 Segmental and somatic dysfunction of lumbar region: Secondary | ICD-10-CM | POA: Diagnosis not present

## 2014-07-18 DIAGNOSIS — M9901 Segmental and somatic dysfunction of cervical region: Secondary | ICD-10-CM | POA: Diagnosis not present

## 2014-07-18 DIAGNOSIS — S29012A Strain of muscle and tendon of back wall of thorax, initial encounter: Secondary | ICD-10-CM | POA: Diagnosis not present

## 2014-07-20 DIAGNOSIS — S29012A Strain of muscle and tendon of back wall of thorax, initial encounter: Secondary | ICD-10-CM | POA: Diagnosis not present

## 2014-07-20 DIAGNOSIS — M47812 Spondylosis without myelopathy or radiculopathy, cervical region: Secondary | ICD-10-CM | POA: Diagnosis not present

## 2014-07-20 DIAGNOSIS — M9902 Segmental and somatic dysfunction of thoracic region: Secondary | ICD-10-CM | POA: Diagnosis not present

## 2014-07-20 DIAGNOSIS — M9901 Segmental and somatic dysfunction of cervical region: Secondary | ICD-10-CM | POA: Diagnosis not present

## 2014-07-20 DIAGNOSIS — M47817 Spondylosis without myelopathy or radiculopathy, lumbosacral region: Secondary | ICD-10-CM | POA: Diagnosis not present

## 2014-07-20 DIAGNOSIS — M9903 Segmental and somatic dysfunction of lumbar region: Secondary | ICD-10-CM | POA: Diagnosis not present

## 2014-07-25 DIAGNOSIS — M9902 Segmental and somatic dysfunction of thoracic region: Secondary | ICD-10-CM | POA: Diagnosis not present

## 2014-07-25 DIAGNOSIS — M47812 Spondylosis without myelopathy or radiculopathy, cervical region: Secondary | ICD-10-CM | POA: Diagnosis not present

## 2014-07-25 DIAGNOSIS — M9901 Segmental and somatic dysfunction of cervical region: Secondary | ICD-10-CM | POA: Diagnosis not present

## 2014-07-25 DIAGNOSIS — S29012A Strain of muscle and tendon of back wall of thorax, initial encounter: Secondary | ICD-10-CM | POA: Diagnosis not present

## 2014-07-25 DIAGNOSIS — M47817 Spondylosis without myelopathy or radiculopathy, lumbosacral region: Secondary | ICD-10-CM | POA: Diagnosis not present

## 2014-07-25 DIAGNOSIS — M9903 Segmental and somatic dysfunction of lumbar region: Secondary | ICD-10-CM | POA: Diagnosis not present

## 2014-07-27 DIAGNOSIS — M9901 Segmental and somatic dysfunction of cervical region: Secondary | ICD-10-CM | POA: Diagnosis not present

## 2014-07-27 DIAGNOSIS — M47817 Spondylosis without myelopathy or radiculopathy, lumbosacral region: Secondary | ICD-10-CM | POA: Diagnosis not present

## 2014-07-27 DIAGNOSIS — M47812 Spondylosis without myelopathy or radiculopathy, cervical region: Secondary | ICD-10-CM | POA: Diagnosis not present

## 2014-07-27 DIAGNOSIS — S29012A Strain of muscle and tendon of back wall of thorax, initial encounter: Secondary | ICD-10-CM | POA: Diagnosis not present

## 2014-07-27 DIAGNOSIS — M9902 Segmental and somatic dysfunction of thoracic region: Secondary | ICD-10-CM | POA: Diagnosis not present

## 2014-07-27 DIAGNOSIS — M9903 Segmental and somatic dysfunction of lumbar region: Secondary | ICD-10-CM | POA: Diagnosis not present

## 2014-08-07 DIAGNOSIS — R1011 Right upper quadrant pain: Secondary | ICD-10-CM | POA: Diagnosis not present

## 2014-08-11 DIAGNOSIS — Z1212 Encounter for screening for malignant neoplasm of rectum: Secondary | ICD-10-CM | POA: Diagnosis not present

## 2014-08-15 ENCOUNTER — Other Ambulatory Visit (INDEPENDENT_AMBULATORY_CARE_PROVIDER_SITE_OTHER): Payer: Medicare Other

## 2014-08-15 ENCOUNTER — Encounter: Payer: Self-pay | Admitting: Internal Medicine

## 2014-08-15 ENCOUNTER — Ambulatory Visit (INDEPENDENT_AMBULATORY_CARE_PROVIDER_SITE_OTHER): Payer: Medicare Other | Admitting: Internal Medicine

## 2014-08-15 VITALS — BP 114/60 | HR 92 | Ht 67.0 in | Wt 142.2 lb

## 2014-08-15 DIAGNOSIS — Z86010 Personal history of colonic polyps: Secondary | ICD-10-CM

## 2014-08-15 DIAGNOSIS — K219 Gastro-esophageal reflux disease without esophagitis: Secondary | ICD-10-CM | POA: Diagnosis not present

## 2014-08-15 DIAGNOSIS — R1084 Generalized abdominal pain: Secondary | ICD-10-CM

## 2014-08-15 DIAGNOSIS — G8929 Other chronic pain: Secondary | ICD-10-CM

## 2014-08-15 DIAGNOSIS — R1011 Right upper quadrant pain: Secondary | ICD-10-CM

## 2014-08-15 DIAGNOSIS — R748 Abnormal levels of other serum enzymes: Secondary | ICD-10-CM

## 2014-08-15 DIAGNOSIS — R1010 Upper abdominal pain, unspecified: Secondary | ICD-10-CM

## 2014-08-15 LAB — BASIC METABOLIC PANEL
BUN: 23 mg/dL (ref 6–23)
CO2: 28 mEq/L (ref 19–32)
CREATININE: 0.96 mg/dL (ref 0.40–1.50)
Calcium: 9.8 mg/dL (ref 8.4–10.5)
Chloride: 103 mEq/L (ref 96–112)
GFR: 82.63 mL/min (ref >60.00)
Glucose, Bld: 103 mg/dL — ABNORMAL HIGH (ref 70–99)
POTASSIUM: 4.2 meq/L (ref 3.5–5.1)
Sodium: 137 mEq/L (ref 135–145)

## 2014-08-15 MED ORDER — MOVIPREP 100 G PO SOLR: 1.0000 | Freq: Once | ORAL | Status: DC

## 2014-08-15 NOTE — Progress Notes (Signed)
HISTORY OF PRESENT ILLNESS:  Vincent Eaton is a 69 y.o. male with a history of asthma, anxiety/depression, multiple urologic issues, and adenomatous colon polyps. He is referred today by his primary care physician, Dr. Shelia Media, regarding abdominal complaints and the need for surveillance colonoscopy. The patient underwent colonoscopy in January 2007 with multiple small adenomas and diverticulosis. Repeat colonoscopy May 2010 revealed moderate diverticulosis and internal hemorrhoids. Otherwise normal. Follow-up in 5 years recommended. His several complaints. Chief complaint is 6 months of generalized abdominal discomfort which is exacerbated by meals and occurs most days. He is very vague in its description. He has a tendency toward constipation. He did try gluten-free diet which did not help. He is also tried Alka-Seltzer which helps. He also has noticed increased belching and bloating. He takes ranitidine 150 mg twice daily for possible reflux. No pyrosis. No dysphagia. His weight has been stable. Next, he mentions a focal right upper quadrant discomfort with radiation into the back. Cannot identify exacerbating or relieving factors for this type of discomfort. No prior history of GI surgery. He has had urologic procedures for stones. He is actually anticipating an upcoming urology evaluation regarding his symptom complex. Finally, complains of burning and itching possibly due to hemorrhoids. Wonders about band ligation therapy for hemorrhoids. Outside records from his PCPs office dated perry 22nd 2016 have been reviewed. Laboratories at that time revealed amylase of 134 (upper limit of normal 115). CBC reveals mild anemia with hemoglobin 13.3 (normal 13.5). Comprehensive metabolic panel with mildly elevated glucose at 133. Otherwise normal including liver tests. He did undergo abdominal ultrasound 07/07/2014 (reviewed). Examination revealed no syphilis Normalities. Visualized portions of the pancreas were  unremarkable. No gallstones.  REVIEW OF SYSTEMS:  All non-GI ROS negative except for sinus and allergy, back pain, fatigue, headaches, muscle pains, nosebleeds, pain with urination, insomnia  Past Medical History  Diagnosis Date  . Right ureteral stone   . History of kidney stones   . Ureterocele   . Type 2 diabetes mellitus   . GERD (gastroesophageal reflux disease)     WATCHES DIET  . Urgency of urination   . Hematuria   . Dysuria   . Wears glasses   . Anxiety and depression   . 1St degree AV block   . BPH (benign prostatic hyperplasia)   . Colon polyps     adenomatous  . Hemangioma of liver   . Asthma     Past Surgical History  Procedure Laterality Date  . Extracorporeal shock wave lithotripsy Right 10-17-2010  . Cystoscopy/retrograde/ureteroscopy Right 03/10/2013    Procedure: CYSTOSCOPY UNROOF URETEROCELE STONE EXTRACTION;  Surgeon: Irine Seal, MD;  Location: [facility];  Service: Urology;  Laterality: Right;    Social History Chick A Mancias  reports that he quit smoking about 21 years ago. His smoking use included Cigarettes. He has a 5 pack-year smoking history. He has never used smokeless tobacco. He reports that he does not drink alcohol or use illicit drugs.  family history includes Asthma in his father; Diabetes in his brother and sister.  No Known Allergies     PHYSICAL EXAMINATION: Vital signs: BP 114/60 mmHg  Pulse 92  Ht 5\' 7"  (1.702 m)  Wt 142 lb 4 oz (64.524 kg)  BMI 22.27 kg/m2  Constitutional: Thin, generally well-appearing, no acute distress Psychiatric: alert and oriented x3, cooperative Eyes: extraocular movements intact, anicteric, conjunctiva pink Mouth: oral pharynx moist, no lesions Neck: supple no lymphadenopathy Cardiovascular: heart regular rate and  rhythm, no murmur Lungs: clear to auscultation bilaterally Abdomen: soft, mild right midabdomen tenderness with palpation, no mass felt, nondistended, no obvious  ascites, no peritoneal signs, normal bowel sounds, no organomegaly Rectal: Deferred until colonoscopy Extremities: no lower extremity edema bilaterally Skin: no lesions on visible extremities Neuro: No focal deficits. Normal deep tendon reflexes. No asterixis.    ASSESSMENT:  #1. Six-month history of vague generalized abdominal discomfort exacerbated by meals as well as focal right-sided abdominal discomfort with radiation into the back. #2. Recent laboratories with normal liver tests and very mild elevation of amylase #3. GERD. No classic symptoms with twice a day Zantac. Question possible breakthrough leading to some abdominal complaints #4. Hemorrhoids. Symptomatic #5. History of adenomatous colon polyps due for surveillance   PLAN:  #1. Schedule upper endoscopy to evaluate abdominal pain.The nature of the procedure, as well as the risks, benefits, and alternatives were carefully and thoroughly reviewed with the patient. Ample time for discussion and questions allowed. The patient understood, was satisfied, and agreed to proceed. #2. Schedule contrast-enhanced CT scan of the abdomen and pelvis to evaluate abdominal pain. Rule out pancreatic lesion. We will contact the patient with results when available #3. Need to manage diabetic medications for his procedures. Hold metformin the day of his exam #4. Surveillance colonoscopy.The nature of the procedure, as well as the risks, benefits, and alternatives were carefully and thoroughly reviewed with the patient. Ample time for discussion and questions allowed. The patient understood, was satisfied, and agreed to proceed. #5. If no contraindication referral for hemorrhoidal banding with Dr. Carlean Purl  45 minutes has been spent face-to-face with this patient. Greater than 50% of the time spent counseling him regarding his various issues as outlined above and answering multiple questions to his satisfaction  A copy of this consultation note has  been sent to his PCP, Dr. Shelia Media

## 2014-08-15 NOTE — Patient Instructions (Addendum)
  Your physician has requested that you go to the basement for the following lab work before leaving today:  BMET  You have been scheduled for a CT scan of the abdomen and pelvis at [facility] ([address]---this is in the same building as Press photographer).   You are scheduled on 08/16/2014 at 9:30am. You should arrive 15 minutes prior to your appointment time for registration. Please follow the written instructions below on the day of your exam:  WARNING: IF YOU ARE ALLERGIC TO IODINE/X-RAY DYE, PLEASE NOTIFY RADIOLOGY IMMEDIATELY AT [phone]! YOU WILL BE GIVEN A 13 HOUR PREMEDICATION PREP.  1) Do not eat or drink anything after 5:30am (4 hours prior to your test) 2) You have been given 2 bottles of oral contrast to drink. The solution may taste better if refrigerated, but do NOT add ice or any other liquid to this solution. Shake well before drinking.    Drink 1 bottle of contrast @ 7:30am (2 hours prior to your exam)  Drink 1 bottle of contrast @ 8:30am (1 hour prior to your exam)  You may take any medications as prescribed with a small amount of water except for the following: Metformin, Glucophage, Glucovance, Avandamet, Riomet, Fortamet, Actoplus Met, Janumet, Glumetza or Metaglip. The above medications must be held the day of the exam AND 48 hours after the exam.  The purpose of you drinking the oral contrast is to aid in the visualization of your intestinal tract. The contrast solution may cause some diarrhea. Before your exam is started, you will be given a small amount of fluid to drink. Depending on your individual set of symptoms, you may also receive an intravenous injection of x-ray contrast/dye. Plan on being at [facility] for 30 minutes or long, depending on the type of exam you are having performed.  If you have any questions regarding your exam or if you need to reschedule, you may call the CT department at [phone] between the hours of 8:00 am  and 5:00 pm, Monday-Friday.  ________________________________________________________________________     Vincent Eaton have been scheduled for an endoscopy and colonoscopy. Please follow the written instructions given to you at your visit today. Please pick up your prep supplies at the pharmacy within the next 1-3 days. If you use inhalers (even only as needed), please bring them with you on the day of your procedure.

## 2014-08-16 ENCOUNTER — Inpatient Hospital Stay: Admission: RE | Admit: 2014-08-16 | Payer: Medicare Other | Source: Ambulatory Visit

## 2014-08-16 ENCOUNTER — Encounter: Payer: Self-pay | Admitting: Internal Medicine

## 2014-08-18 ENCOUNTER — Ambulatory Visit (INDEPENDENT_AMBULATORY_CARE_PROVIDER_SITE_OTHER)
Admission: RE | Admit: 2014-08-18 | Discharge: 2014-08-18 | Disposition: A | Discharge status: Home / Self Care | Payer: Medicare Other | Source: Ambulatory Visit | Attending: Internal Medicine | Admitting: Internal Medicine

## 2014-08-18 ENCOUNTER — Other Ambulatory Visit: Payer: Self-pay | Admitting: Internal Medicine

## 2014-08-18 DIAGNOSIS — K7689 Other specified diseases of liver: Secondary | ICD-10-CM | POA: Diagnosis not present

## 2014-08-18 DIAGNOSIS — R1084 Generalized abdominal pain: Secondary | ICD-10-CM

## 2014-08-18 DIAGNOSIS — Z86010 Personal history of colonic polyps: Secondary | ICD-10-CM

## 2014-08-18 DIAGNOSIS — N200 Calculus of kidney: Secondary | ICD-10-CM | POA: Diagnosis not present

## 2014-08-18 MED ORDER — IOHEXOL 300 MG/ML  SOLN
80.0000 mL | Freq: Once | INTRAMUSCULAR | Status: AC | PRN
  Administered 2014-08-18: 80 mL via INTRAVENOUS

## 2014-09-04 ENCOUNTER — Encounter: Payer: Self-pay | Admitting: Sports Medicine

## 2014-09-04 ENCOUNTER — Ambulatory Visit (INDEPENDENT_AMBULATORY_CARE_PROVIDER_SITE_OTHER): Payer: Medicare Other | Admitting: Sports Medicine

## 2014-09-04 VITALS — BP 122/53 | Ht 68.0 in | Wt 145.0 lb

## 2014-09-04 DIAGNOSIS — M542 Cervicalgia: Secondary | ICD-10-CM | POA: Diagnosis not present

## 2014-09-04 DIAGNOSIS — M5442 Lumbago with sciatica, left side: Secondary | ICD-10-CM | POA: Diagnosis not present

## 2014-09-04 DIAGNOSIS — M5441 Lumbago with sciatica, right side: Secondary | ICD-10-CM
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
## 2014-09-05 ENCOUNTER — Emergency Department (HOSPITAL_COMMUNITY)
Admission: EM | Admit: 2014-09-05 | Discharge: 2014-09-05 | Disposition: A | Discharge status: Home / Self Care | Payer: No Typology Code available for payment source | Attending: Emergency Medicine | Admitting: Emergency Medicine

## 2014-09-05 ENCOUNTER — Encounter (HOSPITAL_COMMUNITY): Payer: Self-pay | Admitting: Emergency Medicine

## 2014-09-05 ENCOUNTER — Emergency Department (HOSPITAL_COMMUNITY): Payer: No Typology Code available for payment source

## 2014-09-05 DIAGNOSIS — Y92410 Unspecified street and highway as the place of occurrence of the external cause: Secondary | ICD-10-CM | POA: Diagnosis not present

## 2014-09-05 DIAGNOSIS — M542 Cervicalgia: Secondary | ICD-10-CM | POA: Diagnosis not present

## 2014-09-05 DIAGNOSIS — Z86010 Personal history of colonic polyps: Secondary | ICD-10-CM | POA: Diagnosis not present

## 2014-09-05 DIAGNOSIS — S39012A Strain of muscle, fascia and tendon of lower back, initial encounter: Secondary | ICD-10-CM | POA: Insufficient documentation

## 2014-09-05 DIAGNOSIS — F329 Major depressive disorder, single episode, unspecified: Secondary | ICD-10-CM | POA: Diagnosis not present

## 2014-09-05 DIAGNOSIS — F419 Anxiety disorder, unspecified: Secondary | ICD-10-CM | POA: Diagnosis not present

## 2014-09-05 DIAGNOSIS — Y9389 Activity, other specified: Secondary | ICD-10-CM | POA: Insufficient documentation

## 2014-09-05 DIAGNOSIS — Z79899 Other long term (current) drug therapy: Secondary | ICD-10-CM | POA: Diagnosis not present

## 2014-09-05 DIAGNOSIS — S4992XA Unspecified injury of left shoulder and upper arm, initial encounter: Secondary | ICD-10-CM | POA: Diagnosis not present

## 2014-09-05 DIAGNOSIS — Y998 Other external cause status: Secondary | ICD-10-CM | POA: Diagnosis not present

## 2014-09-05 DIAGNOSIS — M545 Low back pain: Secondary | ICD-10-CM | POA: Diagnosis not present

## 2014-09-05 DIAGNOSIS — T148 Other injury of unspecified body region: Secondary | ICD-10-CM | POA: Diagnosis not present

## 2014-09-05 DIAGNOSIS — Z87891 Personal history of nicotine dependence: Secondary | ICD-10-CM | POA: Diagnosis not present

## 2014-09-05 DIAGNOSIS — Z7951 Long term (current) use of inhaled steroids: Secondary | ICD-10-CM | POA: Insufficient documentation

## 2014-09-05 DIAGNOSIS — Z87448 Personal history of other diseases of urinary system: Secondary | ICD-10-CM | POA: Insufficient documentation

## 2014-09-05 DIAGNOSIS — S199XXA Unspecified injury of neck, initial encounter: Secondary | ICD-10-CM | POA: Diagnosis not present

## 2014-09-05 DIAGNOSIS — S3992XA Unspecified injury of lower back, initial encounter: Secondary | ICD-10-CM | POA: Diagnosis not present

## 2014-09-05 DIAGNOSIS — Z87438 Personal history of other diseases of male genital organs: Secondary | ICD-10-CM | POA: Insufficient documentation

## 2014-09-05 DIAGNOSIS — S59902A Unspecified injury of left elbow, initial encounter: Secondary | ICD-10-CM | POA: Diagnosis not present

## 2014-09-05 DIAGNOSIS — J45909 Unspecified asthma, uncomplicated: Secondary | ICD-10-CM | POA: Diagnosis not present

## 2014-09-05 DIAGNOSIS — E119 Type 2 diabetes mellitus without complications: Secondary | ICD-10-CM | POA: Insufficient documentation

## 2014-09-05 DIAGNOSIS — S161XXA Strain of muscle, fascia and tendon at neck level, initial encounter: Secondary | ICD-10-CM | POA: Diagnosis not present

## 2014-09-05 DIAGNOSIS — Z86018 Personal history of other benign neoplasm: Secondary | ICD-10-CM | POA: Diagnosis not present

## 2014-09-05 DIAGNOSIS — M25512 Pain in left shoulder: Secondary | ICD-10-CM | POA: Diagnosis not present

## 2014-09-05 DIAGNOSIS — Z87442 Personal history of urinary calculi: Secondary | ICD-10-CM | POA: Diagnosis not present

## 2014-09-05 MED ORDER — TRAMADOL HCL 50 MG PO TABS: 50.0000 mg | ORAL_TABLET | Freq: Four times a day (QID) | ORAL | Status: DC | PRN
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
## 2014-09-05 NOTE — Discharge Instructions (Signed)
Back Pain, Adult Low back pain is very common. About 1 in 5 people have back pain.The cause of low back pain is rarely dangerous. The pain often gets better over time.About half of people with a sudden onset of back pain feel better in just 2 weeks. About 8 in 10 people feel better by 6 weeks.  CAUSES Some common causes of back pain include:  Strain of the muscles or ligaments supporting the spine.  Wear and tear (degeneration) of the spinal discs.  Arthritis.  Direct injury to the back. DIAGNOSIS Most of the time, the direct cause of low back pain is not known.However, back pain can be treated effectively even when the exact cause of the pain is unknown.Answering your caregiver's questions about your overall health and symptoms is one of the most accurate ways to make sure the cause of your pain is not dangerous. If your caregiver needs more information, he or she may order lab work or imaging tests (X-rays or MRIs).However, even if imaging tests show changes in your back, this usually does not require surgery. HOME CARE INSTRUCTIONS For many people, back pain returns.Since low back pain is rarely dangerous, it is often a condition that people can learn to [facility] their own.   Remain active. It is stressful on the back to sit or stand in one place. Do not sit, drive, or stand in one place for more than 30 minutes at a time. Take short walks on level surfaces as soon as pain allows.Try to increase the length of time you walk each day.  Do not stay in bed.Resting more than 1 or 2 days can delay your recovery.  Do not avoid exercise or work.Your body is made to move.It is not dangerous to be active, even though your back may hurt.Your back will likely heal faster if you return to being active before your pain is gone.  Pay attention to your body when you bend and lift. Many people have less discomfortwhen lifting if they bend their knees, keep the load close to their bodies,and  avoid twisting. Often, the most comfortable positions are those that put less stress on your recovering back.  Find a comfortable position to sleep. Use a firm mattress and lie on your side with your knees slightly bent. If you lie on your back, put a pillow under your knees.  Only take over-the-counter or prescription medicines as directed by your caregiver. Over-the-counter medicines to reduce pain and inflammation are often the most helpful.Your caregiver may prescribe muscle relaxant drugs.These medicines help dull your pain so you can more quickly return to your normal activities and healthy exercise.  Put ice on the injured area.  Put ice in a plastic bag.  Place a towel between your skin and the bag.  Leave the ice on for 15-20 minutes, 03-04 times a day for the first 2 to 3 days. After that, ice and heat may be alternated to reduce pain and spasms.  Ask your caregiver about trying back exercises and gentle massage. This may be of some benefit.  Avoid feeling anxious or stressed.Stress increases muscle tension and can worsen back pain.It is important to recognize when you are anxious or stressed and learn ways to manage it.Exercise is a great option. SEEK MEDICAL CARE IF:  You have pain that is not relieved with rest or medicine.  You have pain that does not improve in 1 week.  You have new symptoms.  You are generally not feeling well. SEEK  IMMEDIATE MEDICAL CARE IF:   You have pain that radiates from your back into your legs.  You develop new bowel or bladder control problems.  You have unusual weakness or numbness in your arms or legs.  You develop nausea or vomiting.  You develop abdominal pain.  You feel faint. Document Released: 04/21/2005 Document Revised: 10/21/2011 Document Reviewed: 08/23/2013 [facility] Patient Information 2015 [place], [place]. This information is not intended to replace advice given to you by your health care provider. Make sure you  discuss any questions you have with your health care provider.  Cervical Sprain A cervical sprain is an injury in the neck in which the strong, fibrous tissues (ligaments) that connect your neck bones stretch or tear. Cervical sprains can range from mild to severe. Severe cervical sprains can cause the neck vertebrae to be unstable. This can lead to damage of the spinal cord and can result in serious nervous system problems. The amount of time it takes for a cervical sprain to get better depends on the cause and extent of the injury. Most cervical sprains heal in 1 to 3 weeks. CAUSES  Severe cervical sprains may be caused by:   Contact sport injuries (such as from football, rugby, wrestling, hockey, auto racing, gymnastics, diving, martial arts, or boxing).   Motor vehicle collisions.   Whiplash injuries. This is an injury from a sudden forward and backward whipping movement of the head and neck.  Falls.  Mild cervical sprains may be caused by:   Being in an awkward position, such as while cradling a telephone between your ear and shoulder.   Sitting in a chair that does not offer proper support.   Working at a poorly Landscape architect station.   Looking up or down for long periods of time.  SYMPTOMS   Pain, soreness, stiffness, or a burning sensation in the front, back, or sides of the neck. This discomfort may develop immediately after the injury or slowly, 24 hours or more after the injury.   Pain or tenderness directly in the middle of the back of the neck.   Shoulder or upper back pain.   Limited ability to move the neck.   Headache.   Dizziness.   Weakness, numbness, or tingling in the hands or arms.   Muscle spasms.   Difficulty swallowing or chewing.   Tenderness and swelling of the neck.  DIAGNOSIS  Most of the time your health care provider can diagnose a cervical sprain by taking your history and doing a physical exam. Your health care  provider will ask about previous neck injuries and any known neck problems, such as arthritis in the neck. X-rays may be taken to find out if there are any other problems, such as with the bones of the neck. Other tests, such as a CT scan or MRI, may also be needed.  TREATMENT  Treatment depends on the severity of the cervical sprain. Mild sprains can be treated with rest, keeping the neck in place (immobilization), and pain medicines. Severe cervical sprains are immediately immobilized. Further treatment is done to help with pain, muscle spasms, and other symptoms and may include:  Medicines, such as pain relievers, numbing medicines, or muscle relaxants.   Physical therapy. This may involve stretching exercises, strengthening exercises, and posture training. Exercises and improved posture can help stabilize the neck, strengthen muscles, and help stop symptoms from returning.  HOME CARE INSTRUCTIONS   Put ice on the injured area.   Put ice in a  plastic bag.   Place a towel between your skin and the bag.   Leave the ice on for 15-20 minutes, 3-4 times a day.   If your injury was severe, you may have been given a cervical collar to wear. A cervical collar is a two-piece collar designed to keep your neck from moving while it heals.  Do not remove the collar unless instructed by your health care provider.  If you have long hair, keep it outside of the collar.  Ask your health care provider before making any adjustments to your collar. Minor adjustments may be required over time to improve comfort and reduce pressure on your chin or on the back of your head.  Ifyou are allowed to remove the collar for cleaning or bathing, follow your health care provider's instructions on how to do so safely.  Keep your collar clean by wiping it with mild soap and water and drying it completely. If the collar you have been given includes removable pads, remove them every 1-2 days and hand wash them with  soap and water. Allow them to air dry. They should be completely dry before you wear them in the collar.  If you are allowed to remove the collar for cleaning and bathing, wash and dry the skin of your neck. Check your skin for irritation or sores. If you see any, tell your health care provider.  Do not drive while wearing the collar.   Only take over-the-counter or prescription medicines for pain, discomfort, or fever as directed by your health care provider.   Keep all follow-up appointments as directed by your health care provider.   Keep all physical therapy appointments as directed by your health care provider.   Make any needed adjustments to your workstation to promote good posture.   Avoid positions and activities that make your symptoms worse.   Warm up and stretch before being active to help prevent problems.  SEEK MEDICAL CARE IF:   Your pain is not controlled with medicine.   You are unable to decrease your pain medicine over time as planned.   Your activity level is not improving as expected.  SEEK IMMEDIATE MEDICAL CARE IF:   You develop any bleeding.  You develop stomach upset.  You have signs of an allergic reaction to your medicine.   Your symptoms get worse.   You develop new, unexplained symptoms.   You have numbness, tingling, weakness, or paralysis in any part of your body.  MAKE SURE YOU:   Understand these instructions.  Will watch your condition.  Will get help right away if you are not doing well or get worse. Document Released: 02/16/2007 Document Revised: 04/26/2013 Document Reviewed: 10/27/2012 [facility] Patient Information 2015 [place], [place]. This information is not intended to replace advice given to you by your health care provider. Make sure you discuss any questions you have with your health care provider.
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
## 2014-09-14 ENCOUNTER — Ambulatory Visit
Admission: RE | Admit: 2014-09-14 | Discharge: 2014-09-14 | Disposition: A | Discharge status: Home / Self Care | Payer: Medicare Other | Source: Ambulatory Visit | Attending: Sports Medicine | Admitting: Sports Medicine

## 2014-09-14 DIAGNOSIS — M5031 Other cervical disc degeneration,  high cervical region: Secondary | ICD-10-CM | POA: Diagnosis not present

## 2014-09-14 DIAGNOSIS — M5137 Other intervertebral disc degeneration, lumbosacral region: Secondary | ICD-10-CM | POA: Diagnosis not present

## 2014-09-14 DIAGNOSIS — M5441 Lumbago with sciatica, right side: Secondary | ICD-10-CM

## 2014-09-14 DIAGNOSIS — M47812 Spondylosis without myelopathy or radiculopathy, cervical region: Secondary | ICD-10-CM | POA: Diagnosis not present

## 2014-09-14 DIAGNOSIS — M542 Cervicalgia: Secondary | ICD-10-CM

## 2014-09-14 DIAGNOSIS — M5442 Lumbago with sciatica, left side: Principal | ICD-10-CM

## 2014-09-14 DIAGNOSIS — M5032 Other cervical disc degeneration, mid-cervical region: Secondary | ICD-10-CM | POA: Diagnosis not present

## 2014-09-14 DIAGNOSIS — M47817 Spondylosis without myelopathy or radiculopathy, lumbosacral region: Secondary | ICD-10-CM | POA: Diagnosis not present

## 2014-09-18 ENCOUNTER — Encounter: Payer: Self-pay | Admitting: Sports Medicine

## 2014-09-18 ENCOUNTER — Ambulatory Visit (INDEPENDENT_AMBULATORY_CARE_PROVIDER_SITE_OTHER): Payer: Medicare Other | Admitting: Sports Medicine

## 2014-09-18 VITALS — BP 126/68 | HR 72 | Ht 68.0 in | Wt 145.0 lb

## 2014-09-18 DIAGNOSIS — M5136 Other intervertebral disc degeneration, lumbar region: Secondary | ICD-10-CM

## 2014-09-18 DIAGNOSIS — M542 Cervicalgia: Secondary | ICD-10-CM

## 2014-09-18 DIAGNOSIS — M5030 Other cervical disc degeneration, unspecified cervical region: Secondary | ICD-10-CM | POA: Diagnosis not present
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
## 2014-09-25 ENCOUNTER — Other Ambulatory Visit: Payer: Self-pay | Admitting: Sports Medicine

## 2014-09-25 DIAGNOSIS — M542 Cervicalgia: Secondary | ICD-10-CM

## 2014-09-28 ENCOUNTER — Ambulatory Visit
Admission: RE | Admit: 2014-09-28 | Discharge: 2014-09-28 | Disposition: A | Discharge status: Home / Self Care | Payer: Medicare Other | Source: Ambulatory Visit | Attending: Sports Medicine | Admitting: Sports Medicine

## 2014-09-28 DIAGNOSIS — M47812 Spondylosis without myelopathy or radiculopathy, cervical region: Secondary | ICD-10-CM | POA: Diagnosis not present

## 2014-09-28 DIAGNOSIS — M542 Cervicalgia: Secondary | ICD-10-CM

## 2014-09-28 DIAGNOSIS — M5412 Radiculopathy, cervical region: Secondary | ICD-10-CM | POA: Diagnosis not present

## 2014-09-28 MED ORDER — IOHEXOL 300 MG/ML  SOLN
1.0000 mL | Freq: Once | INTRAMUSCULAR | Status: AC | PRN
  Administered 2014-09-28: 1 mL via EPIDURAL

## 2014-09-28 MED ORDER — TRIAMCINOLONE ACETONIDE 40 MG/ML IJ SUSP (RADIOLOGY)
60.0000 mg | Freq: Once | INTRAMUSCULAR | Status: AC
  Administered 2014-09-28: 60 mg via EPIDURAL

## 2014-09-28 NOTE — Discharge Instructions (Signed)

## 2014-09-29 ENCOUNTER — Other Ambulatory Visit: Payer: Self-pay | Admitting: *Deleted

## 2014-09-29 ENCOUNTER — Other Ambulatory Visit: Payer: Self-pay | Admitting: Sports Medicine

## 2014-09-29 DIAGNOSIS — M545 Low back pain, unspecified: Secondary | ICD-10-CM

## 2014-10-06 ENCOUNTER — Encounter: Payer: BC Managed Care – PPO | Admitting: Internal Medicine

## 2014-10-17 ENCOUNTER — Ambulatory Visit
Admission: RE | Admit: 2014-10-17 | Discharge: 2014-10-17 | Disposition: A | Discharge status: Home / Self Care | Payer: Medicare Other | Source: Ambulatory Visit | Attending: Sports Medicine | Admitting: Sports Medicine

## 2014-10-17 ENCOUNTER — Other Ambulatory Visit: Payer: Medicare Other

## 2014-10-17 DIAGNOSIS — M545 Low back pain: Secondary | ICD-10-CM

## 2014-10-17 DIAGNOSIS — M5416 Radiculopathy, lumbar region: Secondary | ICD-10-CM | POA: Diagnosis not present

## 2014-10-17 MED ORDER — METHYLPREDNISOLONE ACETATE 40 MG/ML INJ SUSP (RADIOLOG
120.0000 mg | Freq: Once | INTRAMUSCULAR | Status: AC
  Administered 2014-10-17: 120 mg via EPIDURAL

## 2014-10-17 MED ORDER — IOHEXOL 180 MG/ML  SOLN
1.0000 mL | Freq: Once | INTRAMUSCULAR | Status: AC | PRN
  Administered 2014-10-17: 1 mL via EPIDURAL

## 2014-10-31 ENCOUNTER — Other Ambulatory Visit: Payer: Self-pay | Admitting: *Deleted

## 2014-10-31 DIAGNOSIS — M545 Low back pain, unspecified: Secondary | ICD-10-CM

## 2014-11-02 ENCOUNTER — Other Ambulatory Visit: Payer: Self-pay | Admitting: Sports Medicine

## 2014-11-02 DIAGNOSIS — G8929 Other chronic pain: Secondary | ICD-10-CM

## 2014-11-02 DIAGNOSIS — M545 Low back pain: Principal | ICD-10-CM

## 2014-11-09 ENCOUNTER — Ambulatory Visit
Admission: RE | Admit: 2014-11-09 | Discharge: 2014-11-09 | Disposition: A | Discharge status: Home / Self Care | Payer: Medicare Other | Source: Ambulatory Visit | Attending: Sports Medicine | Admitting: Sports Medicine

## 2014-11-09 DIAGNOSIS — G8929 Other chronic pain: Secondary | ICD-10-CM

## 2014-11-09 DIAGNOSIS — M545 Low back pain: Principal | ICD-10-CM

## 2014-11-09 MED ORDER — METHYLPREDNISOLONE ACETATE 40 MG/ML INJ SUSP (RADIOLOG: 120.0000 mg | Freq: Once | INTRAMUSCULAR | Status: DC

## 2014-11-09 MED ORDER — IOHEXOL 180 MG/ML  SOLN
1.0000 mL | Freq: Once | INTRAMUSCULAR | Status: AC | PRN
Start: 2014-11-09 — End: 2014-11-09

## 2014-11-09 NOTE — Discharge Instructions (Signed)

## 2014-11-14 ENCOUNTER — Encounter: Payer: BC Managed Care – PPO | Admitting: Internal Medicine

## 2015-01-16 ENCOUNTER — Ambulatory Visit (INDEPENDENT_AMBULATORY_CARE_PROVIDER_SITE_OTHER): Payer: Medicare Other | Admitting: Sports Medicine

## 2015-01-16 ENCOUNTER — Encounter: Payer: Self-pay | Admitting: Sports Medicine

## 2015-01-16 VITALS — BP 118/73 | Ht 68.0 in | Wt 145.0 lb

## 2015-01-16 DIAGNOSIS — M5030 Other cervical disc degeneration, unspecified cervical region: Secondary | ICD-10-CM

## 2015-01-16 MED ORDER — MELOXICAM 15 MG PO TABS: ORAL_TABLET | ORAL | Status: DC

## 2015-01-16 MED ORDER — METHYLPREDNISOLONE ACETATE 80 MG/ML IJ SUSP
80.0000 mg | Freq: Once | INTRAMUSCULAR | Status: AC
  Administered 2015-01-16: 80 mg via INTRAMUSCULAR

## 2015-01-16 NOTE — Progress Notes (Signed)
   Subjective:    Patient ID: Vincent Eaton, male    DOB: 1945-05-08, 69 y.o.   MRN: [id number]  HPI   Patient comes in today with returning neck pain and left arm pain. He has a history of cervical degenerative disc disease. An MRI scan of his cervical spine done earlier this year showed prominent impingement at C4-C5 and moderate impingement at C3-C4. He has undergone epidural steroid injections in the past and has had good results with them but has been told by radiology that he has to wait until November before repeating the series. He was doing well up until a recent overseas trip. He had do quite a bit of traveling and as a result his symptoms have returned. They are identical in nature to what he's experienced previously. He describes a burning pain in his neck that radiates down into his left arm. No weakness. He is taking over-the-counter ibuprofen as needed which does seem to help some. He is asking about the possibility of cupping as a physical therapy modality to help with his pain.  Interim medical history reviewed Medications reviewed Allergies reviewed      Review of Systems     Objective:   Physical Exam Well-developed, well-nourished. No acute distress. Sitting comfortably in the exam room  Cervical spine: Limited cervical motion in all planes due to pain. No tenderness over the cervical midline. No tenderness over the paraspinal musculature.  Neurological exam: No focal neurological deficit of either upper extremity  MRI results are as above       Assessment & Plan:  Returning neck and left arm pain secondary to cervical degenerative disc disease and cervical radiculopathy  I discussed the patient's treatment options with him including IM Depo-Medrol injection, oral anti-inflammatories, physical therapy, and neurosurgical referral. Patient would like to proceed with an IM Depo-Medrol injection. We will inject him with 80 mg IM today. He would also like to try  a prescription strength anti-inflammatory. I will try him on meloxicam 15 mg daily as needed. He will stop his ibuprofen. He has not had much success with physical therapy in the past but has had better success with chiropractic treatment. I've encouraged him to return to his chiropractor and ask them about the possibility of cupping. I'm not sure that it will do much for his pain but to my knowledge the side effects are minimal. Patient will return to the office in 3-4 weeks for reevaluation. He understands that a repeat cervical spine ESI series is a possibility in November. He also understands that if symptoms become intolerable then we could consider neurosurgical referral. Call with questions or concerns in the interim.

## 2015-01-17 DIAGNOSIS — Z23 Encounter for immunization: Secondary | ICD-10-CM | POA: Diagnosis not present

## 2015-01-17 DIAGNOSIS — E1139 Type 2 diabetes mellitus with other diabetic ophthalmic complication: Secondary | ICD-10-CM | POA: Diagnosis not present

## 2015-01-17 DIAGNOSIS — K626 Ulcer of anus and rectum: Secondary | ICD-10-CM | POA: Diagnosis not present

## 2015-01-22 ENCOUNTER — Telehealth: Payer: Self-pay | Admitting: Internal Medicine

## 2015-01-22 NOTE — Telephone Encounter (Signed)
No charge. 

## 2015-01-24 ENCOUNTER — Encounter: Payer: BC Managed Care – PPO | Admitting: Internal Medicine

## 2015-02-13 ENCOUNTER — Ambulatory Visit: Payer: Medicare Other | Admitting: Sports Medicine

## 2015-03-22 ENCOUNTER — Other Ambulatory Visit: Payer: Self-pay | Admitting: *Deleted

## 2015-03-22 DIAGNOSIS — M542 Cervicalgia: Secondary | ICD-10-CM

## 2015-03-26 ENCOUNTER — Other Ambulatory Visit: Payer: Self-pay | Admitting: Sports Medicine

## 2015-03-26 DIAGNOSIS — M542 Cervicalgia: Secondary | ICD-10-CM

## 2015-03-26 DIAGNOSIS — G8929 Other chronic pain: Secondary | ICD-10-CM

## 2015-03-26 DIAGNOSIS — M545 Low back pain: Principal | ICD-10-CM

## 2015-04-10 ENCOUNTER — Ambulatory Visit
Admission: RE | Admit: 2015-04-10 | Discharge: 2015-04-10 | Disposition: A | Discharge status: Home / Self Care | Payer: Medicare Other | Source: Ambulatory Visit | Attending: Sports Medicine | Admitting: Sports Medicine

## 2015-04-10 DIAGNOSIS — M542 Cervicalgia: Principal | ICD-10-CM

## 2015-04-10 DIAGNOSIS — G8929 Other chronic pain: Secondary | ICD-10-CM

## 2015-04-10 MED ORDER — IOHEXOL 300 MG/ML  SOLN
1.0000 mL | Freq: Once | INTRAMUSCULAR | Status: AC | PRN
  Administered 2015-04-10: 1 mL via INTRAVENOUS

## 2015-04-10 MED ORDER — TRIAMCINOLONE ACETONIDE 40 MG/ML IJ SUSP (RADIOLOGY)
60.0000 mg | Freq: Once | INTRAMUSCULAR | Status: AC
  Administered 2015-04-10: 60 mg via EPIDURAL

## 2015-04-10 NOTE — Discharge Instructions (Signed)

## 2015-04-16 ENCOUNTER — Other Ambulatory Visit: Payer: Self-pay | Admitting: *Deleted

## 2015-04-16 DIAGNOSIS — M5030 Other cervical disc degeneration, unspecified cervical region: Secondary | ICD-10-CM

## 2015-05-08 ENCOUNTER — Other Ambulatory Visit: Payer: Self-pay | Admitting: Sports Medicine

## 2015-05-08 DIAGNOSIS — M542 Cervicalgia: Principal | ICD-10-CM

## 2015-05-08 DIAGNOSIS — G8929 Other chronic pain: Secondary | ICD-10-CM

## 2015-05-16 ENCOUNTER — Other Ambulatory Visit: Payer: Self-pay | Admitting: *Deleted

## 2015-05-16 ENCOUNTER — Ambulatory Visit
Admission: RE | Admit: 2015-05-16 | Discharge: 2015-05-16 | Disposition: A | Discharge status: Home / Self Care | Payer: Medicare Other | Source: Ambulatory Visit | Attending: Sports Medicine | Admitting: Sports Medicine

## 2015-05-16 DIAGNOSIS — M545 Low back pain, unspecified: Secondary | ICD-10-CM

## 2015-05-16 DIAGNOSIS — M4727 Other spondylosis with radiculopathy, lumbosacral region: Secondary | ICD-10-CM | POA: Diagnosis not present

## 2015-05-16 DIAGNOSIS — G8929 Other chronic pain: Secondary | ICD-10-CM

## 2015-05-16 DIAGNOSIS — M542 Cervicalgia: Principal | ICD-10-CM

## 2015-05-16 MED ORDER — IOHEXOL 300 MG/ML  SOLN
1.0000 mL | Freq: Once | INTRAMUSCULAR | Status: AC | PRN
  Administered 2015-05-16: 1 mL via EPIDURAL

## 2015-05-16 MED ORDER — TRIAMCINOLONE ACETONIDE 40 MG/ML IJ SUSP (RADIOLOGY)
60.0000 mg | Freq: Once | INTRAMUSCULAR | Status: AC
  Administered 2015-05-16: 60 mg via EPIDURAL

## 2015-05-16 NOTE — Discharge Instructions (Signed)

## 2015-06-06 ENCOUNTER — Ambulatory Visit (AMBULATORY_SURGERY_CENTER): Payer: Self-pay | Admitting: *Deleted

## 2015-06-06 VITALS — Ht 68.0 in | Wt 146.0 lb

## 2015-06-06 DIAGNOSIS — R1084 Generalized abdominal pain: Secondary | ICD-10-CM

## 2015-06-06 DIAGNOSIS — Z86010 Personal history of colonic polyps: Secondary | ICD-10-CM

## 2015-06-06 NOTE — Progress Notes (Signed)
No egg or soy allergy. No anesthesia problems.  No home O2.  No diet meds.  

## 2015-06-19 ENCOUNTER — Telehealth: Payer: Self-pay | Admitting: Internal Medicine

## 2015-06-19 NOTE — Telephone Encounter (Signed)
Pts ECL rescheduled to April due to pt not feeling well, afraid he may be worse tomorrow. Pt requested to speak to someone regarding his insurance and procedures. Message sent to [facility] to call pt.

## 2015-06-19 NOTE — Telephone Encounter (Signed)
LM ON PT VM THAT HE HAD DOUBLE COVERAGE AND IF HE HAD ANY OTHER QUESTIONS TO CALL BACK.

## 2015-06-20 ENCOUNTER — Encounter: Payer: BC Managed Care – PPO | Admitting: Internal Medicine

## 2015-06-26 ENCOUNTER — Encounter: Payer: Self-pay | Admitting: *Deleted

## 2015-06-26 DIAGNOSIS — M542 Cervicalgia: Secondary | ICD-10-CM

## 2015-06-27 ENCOUNTER — Other Ambulatory Visit: Payer: Self-pay | Admitting: Sports Medicine

## 2015-06-27 DIAGNOSIS — M545 Low back pain, unspecified: Secondary | ICD-10-CM

## 2015-06-27 DIAGNOSIS — G8929 Other chronic pain: Secondary | ICD-10-CM

## 2015-07-04 ENCOUNTER — Ambulatory Visit
Admission: RE | Admit: 2015-07-04 | Discharge: 2015-07-04 | Disposition: A | Discharge status: Home / Self Care | Payer: BC Managed Care – PPO | Source: Ambulatory Visit | Attending: Sports Medicine | Admitting: Sports Medicine

## 2015-07-04 DIAGNOSIS — M47812 Spondylosis without myelopathy or radiculopathy, cervical region: Secondary | ICD-10-CM | POA: Diagnosis not present

## 2015-07-04 DIAGNOSIS — M545 Low back pain, unspecified: Secondary | ICD-10-CM

## 2015-07-04 DIAGNOSIS — G8929 Other chronic pain: Secondary | ICD-10-CM

## 2015-07-04 MED ORDER — TRIAMCINOLONE ACETONIDE 40 MG/ML IJ SUSP (RADIOLOGY)
60.0000 mg | Freq: Once | INTRAMUSCULAR | Status: AC
  Administered 2015-07-04: 60 mg via EPIDURAL

## 2015-07-04 MED ORDER — IOHEXOL 180 MG/ML  SOLN
1.0000 mL | Freq: Once | INTRAMUSCULAR | Status: AC | PRN
  Administered 2015-07-04: 1 mL via EPIDURAL

## 2015-07-04 MED ORDER — METHYLPREDNISOLONE ACETATE 40 MG/ML INJ SUSP (RADIOLOG
120.0000 mg | Freq: Once | INTRAMUSCULAR | Status: AC
  Administered 2015-07-04: 120 mg via EPIDURAL

## 2015-07-04 MED ORDER — IOHEXOL 300 MG/ML  SOLN
1.0000 mL | Freq: Once | INTRAMUSCULAR | Status: AC | PRN
  Administered 2015-07-04: 1 mL via EPIDURAL

## 2015-07-16 DIAGNOSIS — S29012A Strain of muscle and tendon of back wall of thorax, initial encounter: Secondary | ICD-10-CM | POA: Diagnosis not present

## 2015-07-16 DIAGNOSIS — M9903 Segmental and somatic dysfunction of lumbar region: Secondary | ICD-10-CM | POA: Diagnosis not present

## 2015-07-16 DIAGNOSIS — M9902 Segmental and somatic dysfunction of thoracic region: Secondary | ICD-10-CM | POA: Diagnosis not present

## 2015-07-16 DIAGNOSIS — M47817 Spondylosis without myelopathy or radiculopathy, lumbosacral region: Secondary | ICD-10-CM | POA: Diagnosis not present

## 2015-07-16 DIAGNOSIS — M9905 Segmental and somatic dysfunction of pelvic region: Secondary | ICD-10-CM | POA: Diagnosis not present

## 2015-07-16 DIAGNOSIS — S39012A Strain of muscle, fascia and tendon of lower back, initial encounter: Secondary | ICD-10-CM | POA: Diagnosis not present

## 2015-07-17 DIAGNOSIS — M47817 Spondylosis without myelopathy or radiculopathy, lumbosacral region: Secondary | ICD-10-CM | POA: Diagnosis not present

## 2015-07-17 DIAGNOSIS — M9903 Segmental and somatic dysfunction of lumbar region: Secondary | ICD-10-CM | POA: Diagnosis not present

## 2015-07-17 DIAGNOSIS — S39012A Strain of muscle, fascia and tendon of lower back, initial encounter: Secondary | ICD-10-CM | POA: Diagnosis not present

## 2015-07-17 DIAGNOSIS — M9902 Segmental and somatic dysfunction of thoracic region: Secondary | ICD-10-CM | POA: Diagnosis not present

## 2015-07-17 DIAGNOSIS — M9905 Segmental and somatic dysfunction of pelvic region: Secondary | ICD-10-CM | POA: Diagnosis not present

## 2015-07-17 DIAGNOSIS — S29012A Strain of muscle and tendon of back wall of thorax, initial encounter: Secondary | ICD-10-CM | POA: Diagnosis not present

## 2015-07-25 DIAGNOSIS — M47817 Spondylosis without myelopathy or radiculopathy, lumbosacral region: Secondary | ICD-10-CM | POA: Diagnosis not present

## 2015-07-25 DIAGNOSIS — M9905 Segmental and somatic dysfunction of pelvic region: Secondary | ICD-10-CM | POA: Diagnosis not present

## 2015-07-25 DIAGNOSIS — S39012A Strain of muscle, fascia and tendon of lower back, initial encounter: Secondary | ICD-10-CM | POA: Diagnosis not present

## 2015-07-25 DIAGNOSIS — S29012A Strain of muscle and tendon of back wall of thorax, initial encounter: Secondary | ICD-10-CM | POA: Diagnosis not present

## 2015-07-25 DIAGNOSIS — M9903 Segmental and somatic dysfunction of lumbar region: Secondary | ICD-10-CM | POA: Diagnosis not present

## 2015-07-25 DIAGNOSIS — M9902 Segmental and somatic dysfunction of thoracic region: Secondary | ICD-10-CM | POA: Diagnosis not present

## 2015-07-27 DIAGNOSIS — M9902 Segmental and somatic dysfunction of thoracic region: Secondary | ICD-10-CM | POA: Diagnosis not present

## 2015-07-27 DIAGNOSIS — M47817 Spondylosis without myelopathy or radiculopathy, lumbosacral region: Secondary | ICD-10-CM | POA: Diagnosis not present

## 2015-07-27 DIAGNOSIS — M9905 Segmental and somatic dysfunction of pelvic region: Secondary | ICD-10-CM | POA: Diagnosis not present

## 2015-07-27 DIAGNOSIS — S29012A Strain of muscle and tendon of back wall of thorax, initial encounter: Secondary | ICD-10-CM | POA: Diagnosis not present

## 2015-07-27 DIAGNOSIS — M9903 Segmental and somatic dysfunction of lumbar region: Secondary | ICD-10-CM | POA: Diagnosis not present

## 2015-07-27 DIAGNOSIS — S39012A Strain of muscle, fascia and tendon of lower back, initial encounter: Secondary | ICD-10-CM | POA: Diagnosis not present

## 2015-07-31 DIAGNOSIS — M47817 Spondylosis without myelopathy or radiculopathy, lumbosacral region: Secondary | ICD-10-CM | POA: Diagnosis not present

## 2015-07-31 DIAGNOSIS — M9905 Segmental and somatic dysfunction of pelvic region: Secondary | ICD-10-CM | POA: Diagnosis not present

## 2015-07-31 DIAGNOSIS — S29012A Strain of muscle and tendon of back wall of thorax, initial encounter: Secondary | ICD-10-CM | POA: Diagnosis not present

## 2015-07-31 DIAGNOSIS — M9902 Segmental and somatic dysfunction of thoracic region: Secondary | ICD-10-CM | POA: Diagnosis not present

## 2015-07-31 DIAGNOSIS — S39012A Strain of muscle, fascia and tendon of lower back, initial encounter: Secondary | ICD-10-CM | POA: Diagnosis not present

## 2015-07-31 DIAGNOSIS — M9903 Segmental and somatic dysfunction of lumbar region: Secondary | ICD-10-CM | POA: Diagnosis not present

## 2015-08-03 DIAGNOSIS — M47817 Spondylosis without myelopathy or radiculopathy, lumbosacral region: Secondary | ICD-10-CM | POA: Diagnosis not present

## 2015-08-03 DIAGNOSIS — M9903 Segmental and somatic dysfunction of lumbar region: Secondary | ICD-10-CM | POA: Diagnosis not present

## 2015-08-03 DIAGNOSIS — S29012A Strain of muscle and tendon of back wall of thorax, initial encounter: Secondary | ICD-10-CM | POA: Diagnosis not present

## 2015-08-03 DIAGNOSIS — M9905 Segmental and somatic dysfunction of pelvic region: Secondary | ICD-10-CM | POA: Diagnosis not present

## 2015-08-03 DIAGNOSIS — S39012A Strain of muscle, fascia and tendon of lower back, initial encounter: Secondary | ICD-10-CM | POA: Diagnosis not present

## 2015-08-03 DIAGNOSIS — M9902 Segmental and somatic dysfunction of thoracic region: Secondary | ICD-10-CM | POA: Diagnosis not present

## 2015-08-21 DIAGNOSIS — K219 Gastro-esophageal reflux disease without esophagitis: Secondary | ICD-10-CM | POA: Diagnosis not present

## 2015-08-21 DIAGNOSIS — Z125 Encounter for screening for malignant neoplasm of prostate: Secondary | ICD-10-CM | POA: Diagnosis not present

## 2015-08-21 DIAGNOSIS — E1139 Type 2 diabetes mellitus with other diabetic ophthalmic complication: Secondary | ICD-10-CM | POA: Diagnosis not present

## 2015-08-21 DIAGNOSIS — Z0000 Encounter for general adult medical examination without abnormal findings: Secondary | ICD-10-CM | POA: Diagnosis not present

## 2015-08-22 ENCOUNTER — Ambulatory Visit (AMBULATORY_SURGERY_CENTER): Payer: Medicare Other | Admitting: Internal Medicine

## 2015-08-22 ENCOUNTER — Encounter: Payer: Self-pay | Admitting: Internal Medicine

## 2015-08-22 VITALS — BP 116/63 | HR 64 | Temp 98.0°F | Resp 11 | Ht 68.0 in | Wt 146.0 lb

## 2015-08-22 DIAGNOSIS — R109 Unspecified abdominal pain: Secondary | ICD-10-CM | POA: Diagnosis not present

## 2015-08-22 DIAGNOSIS — R1013 Epigastric pain: Secondary | ICD-10-CM

## 2015-08-22 DIAGNOSIS — E119 Type 2 diabetes mellitus without complications: Secondary | ICD-10-CM | POA: Diagnosis not present

## 2015-08-22 DIAGNOSIS — Z86010 Personal history of colonic polyps: Secondary | ICD-10-CM

## 2015-08-22 DIAGNOSIS — R1084 Generalized abdominal pain: Secondary | ICD-10-CM

## 2015-08-22 DIAGNOSIS — D123 Benign neoplasm of transverse colon: Secondary | ICD-10-CM | POA: Diagnosis not present

## 2015-08-22 MED ORDER — SODIUM CHLORIDE 0.9 % IV SOLN: 500.0000 mL | INTRAVENOUS | Status: DC

## 2015-08-22 NOTE — Op Note (Signed)
[facility] Patient Name: Vincent Eaton Procedure Date: 08/22/2015 9:49 AM MRN: [id number] Endoscopist: Docia Chuck. Henrene Pastor , MD Age: 70 Date of Birth: [birth year] Gender: Male Procedure:                Upper GI endoscopy Indications:              Dyspepsia. . Seen in office 1 year ago. Just now                            presenting for his examination. Symptoms on                            occasion but less than 1 year ago per patient Medicines:                Monitored Anesthesia Care Procedure:                Pre-Anesthesia Assessment:                           - Prior to the procedure, a History and Physical                            was performed, and patient medications and                            allergies were reviewed. The patient's tolerance of                            previous anesthesia was also reviewed. The risks                            and benefits of the procedure and the sedation                            options and risks were discussed with the patient.                            All questions were answered, and informed consent                            was obtained. Prior Anticoagulants: The patient has                            taken no previous anticoagulant or antiplatelet                            agents. ASA Grade Assessment: II - A patient with                            mild systemic disease. After reviewing the risks                            and benefits, the patient was deemed in  satisfactory condition to undergo the procedure.                           After obtaining informed consent, the endoscope was                            passed under direct vision. Throughout the                            procedure, the patient's blood pressure, pulse, and                            oxygen saturations were monitored continuously. The                            Model GIF-HQ190 [id number]) scope was introduced                          through the mouth, and advanced to the second part                            of duodenum. The upper GI endoscopy was                            accomplished without difficulty. The patient                            tolerated the procedure well. Scope In: Scope Out: Findings:                 The esophagus was normal.                           The stomach was normal.                           The examined duodenum was normal.                           The cardia and gastric fundus were normal on                            retroflexion. Complications:            No immediate complications. Estimated Blood Loss:     Estimated blood loss: none. Impression:               - Normal esophagus.                           - Normal stomach.                           - Normal examined duodenum.                           - No specimens collected. Recommendation:           - Patient has a contact  number available for                            emergencies. The signs and symptoms of potential                            delayed complications were discussed with the                            patient. Return to normal activities tomorrow.                            Written discharge instructions were provided to the                            patient.                           - Resume previous diet.                           - Continue present medications. Docia Chuck. Henrene Pastor, MD 08/22/2015 10:40:43 AM This report has been signed electronically. CC Letter to:             Deland Pretty, MD

## 2015-08-22 NOTE — Patient Instructions (Signed)

## 2015-08-22 NOTE — Progress Notes (Signed)
Patient awakening,vss,report to rn 

## 2015-08-22 NOTE — Op Note (Signed)
[facility] Patient Name: Vincent Eaton Procedure Date: 08/22/2015 9:50 AM MRN: [id number] Endoscopist: Docia Chuck. Henrene Pastor , MD Age: 70 Date of Birth: 09-Aug-1945 Gender: Male Procedure:                Colonoscopy with cold snare polypectomy x 2 Indications:              High risk colon cancer surveillance: Personal                            history of colonic polyps. Small tubular adenoma                            2007. Negative for neoplasia 2010 Medicines:                Monitored Anesthesia Care Procedure:                Pre-Anesthesia Assessment:                           - Prior to the procedure, a History and Physical                            was performed, and patient medications and                            allergies were reviewed. The patient's tolerance of                            previous anesthesia was also reviewed. The risks                            and benefits of the procedure and the sedation                            options and risks were discussed with the patient.                            All questions were answered, and informed consent                            was obtained. Prior Anticoagulants: The patient has                            taken no previous anticoagulant or antiplatelet                            agents. ASA Grade Assessment: II - A patient with                            mild systemic disease. After reviewing the risks                            and benefits, the patient was deemed in  satisfactory condition to undergo the procedure.                           After obtaining informed consent, the colonoscope                            was passed under direct vision. Throughout the                            procedure, the patient's blood pressure, pulse, and                            oxygen saturations were monitored continuously. The                            Model CF-[id number]) scope  was introduced                            through the anus and advanced to the the cecum,                            identified by appendiceal orifice and ileocecal                            valve. The ileocecal valve, appendiceal orifice,                            and rectum were photographed. The quality of the                            bowel preparation was excellent. The colonoscopy                            was performed without difficulty. The patient                            tolerated the procedure well. The bowel preparation                            used was MoviPrep. Scope In: 10:04:51 AM Scope Out: 10:23:24 AM Scope Withdrawal Time: 0 hours 15 minutes 0 seconds  Total Procedure Duration: 0 hours 18 minutes 33 seconds  Findings:                 Two polyps were found in the transverse colon. The                            polyps were 3 mm in size. These polyps were removed                            with a cold snare. Resection and retrieval were                            complete.  Non-bleeding internal hemorrhoids were found during                            retroflexion. The hemorrhoids were small.                           The exam was otherwise without abnormality on                            direct and retroflexion views.                           [Number] [Opening] diverticula were found in the                            sigmoid colon. Complications:            No immediate complications. Estimated blood loss:                            None. Estimated Blood Loss:     Estimated blood loss: none. Recommendation:           - Repeat colonoscopy in 5 years for surveillance.                           - Patient has a contact number available for                            emergencies. The signs and symptoms of potential                            delayed complications were discussed with the                            patient. Return to normal  activities tomorrow.                            Written discharge instructions were provided to the                            patient.                           - Resume previous diet.                           - Continue present medications.                           - Await pathology results. Docia Chuck. Henrene Pastor, MD 08/22/2015 10:36:49 AM This report has been signed electronically. CC Letter to:             Deland Pretty, MD

## 2015-08-22 NOTE — Progress Notes (Signed)
Called to room to assist during endoscopic procedure.  Patient ID and intended procedure confirmed with present staff. Received instructions for my participation in the procedure from the performing physician.  

## 2015-08-23 ENCOUNTER — Telehealth: Payer: Self-pay | Admitting: *Deleted

## 2015-08-23 NOTE — Telephone Encounter (Signed)
  Follow up Call-  Call back number 08/22/2015  Post procedure Call Back phone  # [phone]  Permission to leave phone message Yes     No answer, left message.

## 2015-08-24 DIAGNOSIS — Z87442 Personal history of urinary calculi: Secondary | ICD-10-CM | POA: Diagnosis not present

## 2015-08-24 DIAGNOSIS — Z23 Encounter for immunization: Secondary | ICD-10-CM | POA: Diagnosis not present

## 2015-08-24 DIAGNOSIS — Z87448 Personal history of other diseases of urinary system: Secondary | ICD-10-CM | POA: Diagnosis not present

## 2015-08-24 DIAGNOSIS — Z1212 Encounter for screening for malignant neoplasm of rectum: Secondary | ICD-10-CM | POA: Diagnosis not present

## 2015-08-24 DIAGNOSIS — E1142 Type 2 diabetes mellitus with diabetic polyneuropathy: Secondary | ICD-10-CM | POA: Diagnosis not present

## 2015-08-24 DIAGNOSIS — E0840 Diabetes mellitus due to underlying condition with diabetic neuropathy, unspecified: Secondary | ICD-10-CM | POA: Diagnosis not present

## 2015-08-27 ENCOUNTER — Encounter: Payer: Self-pay | Admitting: Internal Medicine

## 2015-09-05 ENCOUNTER — Ambulatory Visit (INDEPENDENT_AMBULATORY_CARE_PROVIDER_SITE_OTHER): Payer: Medicare Other | Admitting: Sports Medicine

## 2015-09-05 ENCOUNTER — Encounter: Payer: Self-pay | Admitting: Sports Medicine

## 2015-09-05 VITALS — BP 102/56 | Ht 68.0 in | Wt 145.0 lb

## 2015-09-05 DIAGNOSIS — M5136 Other intervertebral disc degeneration, lumbar region: Secondary | ICD-10-CM

## 2015-09-05 DIAGNOSIS — M2550 Pain in unspecified joint: Secondary | ICD-10-CM | POA: Diagnosis present

## 2015-09-05 DIAGNOSIS — M5030 Other cervical disc degeneration, unspecified cervical region: Secondary | ICD-10-CM | POA: Diagnosis not present

## 2015-09-05 LAB — RHEUMATOID FACTOR: Rhuematoid fact SerPl-aCnc: <10 IU/mL (ref <=14)

## 2015-09-05 LAB — C-REACTIVE PROTEIN

## 2015-09-05 NOTE — Progress Notes (Signed)
Patient ID: Vincent Eaton, male   DOB: [birth year], 70 y.o.   MRN: [id number]  Patient comes in today at the request of his chiropractor for reevaluation. He continues to struggle with both neck and low back pain. He has a documented history of cervical spondylosis and lumbar spondylosis which will cause him pain and, at times, radiculopathy. He has had several epidural steroid injections in both his neck and his low back. Each injection does seem to provide him with a positive yet limited response. He has had multiple chiropractic treatments without improvement. His chiropractor was worried about a possible systemic arthropathy is a cause for his pain.  Physical exam was not repeated today. I simply discussed his ongoing chronic neck and low back pain. I'm going to check a rheumatological panel including a sedimentation rate, C-reactive protein, ANA, rheumatoid factor, anti-CCP, and HLA-B27. If that blood work is unremarkable, I would recommend consultation with neurosurgery to see whether or not they think that he has any pathology that may be amendable to surgery. He has certainly failed exhaustive conservative treatment up to this point. I will call him with his blood work once available at which point we will decide whether or not neurosurgical referral is indicated.

## 2015-09-06 LAB — CYCLIC CITRUL PEPTIDE ANTIBODY, IGG: Cyclic Citrullin Peptide Ab: <16 Units

## 2015-09-06 LAB — ANA: ANA: NEGATIVE

## 2015-09-06 LAB — SEDIMENTATION RATE: Sed Rate: 1 mm/hr (ref 0–20)

## 2015-09-11 LAB — HLA-B27 ANTIGEN: DNA RESULT: NEGATIVE

## 2015-09-17 ENCOUNTER — Telehealth: Payer: Self-pay | Admitting: Sports Medicine

## 2015-09-17 NOTE — Telephone Encounter (Signed)
  I spoke with the patient on the phone today after reviewing his rheumatological panel. It is unremarkable. Sedimentation rate is normal. Rheumatoid factor normal. ANA and HLA-B27 all normal. We have discussed the possibility of a neurosurgical referral if his blood work was normal. However, he is not interested in any sort of surgical solution. I explained to him that pain management may be a reasonable route for him and I've encouraged him to discuss this further with his PCP, Dr Shelia Media. He would prefer that over surgery. I will see him as needed.

## 2016-01-10 DIAGNOSIS — E1139 Type 2 diabetes mellitus with other diabetic ophthalmic complication: Secondary | ICD-10-CM | POA: Diagnosis not present

## 2016-01-14 DIAGNOSIS — E1139 Type 2 diabetes mellitus with other diabetic ophthalmic complication: Secondary | ICD-10-CM | POA: Diagnosis not present

## 2016-04-10 DIAGNOSIS — E1139 Type 2 diabetes mellitus with other diabetic ophthalmic complication: Secondary | ICD-10-CM | POA: Diagnosis not present

## 2016-04-14 DIAGNOSIS — J019 Acute sinusitis, unspecified: Secondary | ICD-10-CM | POA: Diagnosis not present

## 2016-04-14 DIAGNOSIS — E119 Type 2 diabetes mellitus without complications: Secondary | ICD-10-CM | POA: Diagnosis not present
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
## 2017-11-12 ENCOUNTER — Ambulatory Visit: Payer: Medicare Other | Admitting: Neurology

## 2017-11-12 ENCOUNTER — Encounter: Payer: Self-pay | Admitting: Neurology

## 2017-11-12 VITALS — BP 139/75 | HR 64 | Ht 68.0 in | Wt 142.0 lb

## 2017-11-12 DIAGNOSIS — G629 Polyneuropathy, unspecified: Secondary | ICD-10-CM | POA: Insufficient documentation

## 2017-11-12 DIAGNOSIS — G6289 Other specified polyneuropathies: Secondary | ICD-10-CM

## 2017-11-12 MED ORDER — GABAPENTIN 100 MG PO CAPS: 100.0000 mg | ORAL_CAPSULE | Freq: Three times a day (TID) | ORAL | 11 refills | Status: DC

## 2017-11-12 NOTE — Progress Notes (Signed)
PATIENT: Vincent Eaton DOB: 1946/03/20  Chief Complaint  Patient presents with  . Peripheral Neuropathy    Reports burning, tingling and pain in his bilateral feet. He has tried several medications in the past but does not recall the names.  Says the most recent being Lyrica (stopped two years ago and does not remember the dose).  The symptoms have worsened and he would like to discuss treatment options.  Marland Kitchen PCP    Deland Pretty, MD     HISTORICAL  Vincent Eaton is a 72 year old male, seen in refer by his primary care physician Dr. Shelia Media, [facility] for evaluation of peripheral neuropathy, initial evaluation was on November 12, 2017  He had a history of diabetes since 2009 take Metformin, also suffered depression, gone through extreme stress,  Since 2010, he noticed gradual onset bilateral feet numbness, involving plantar surface and toes, gradually getting worse, sensitive to touch, even if he put on socks, he felt his feet sensitive, especially after bearing weight, prolonged walking, feet become more tender, but he denies gait abnormality, still able to run 3 miles few times each week at his age, he denies bowel and bladder incontinence, no bilateral upper extremity paresthesia,  Over the years, he has tried Lyrica, Cymbalta, all with limited help initially, but quickly lost its benefit,  He also complains of depression, insomnia going through extreme family stress.  MRI of lumbar spine in May 2016 showed lumbar spondylosis, degenerative disc disease, moderate impingement at L4-5, mild to moderate impingement at L5-S1,  MRI of cervical spine showed cervical spondylosis degenerative disc disease, causing moderate to prominent impingement at the C4-5 moderate at C3-4,  Laboratory evaluations in April 2018, normal CBC, hemoglobin of 13, A1C 7.2, CMP, creatinine 0.9, LDL was 92   REVIEW OF SYSTEMS: Full 14 system review of systems performed and notable only for sleepiness, wheezing  not enough sleep  ALLERGIES: No Known Allergies  HOME MEDICATIONS: Current Outpatient Medications  Medication Sig Dispense Refill  . fluticasone (FLONASE) 50 MCG/ACT nasal spray Place 1 spray into both nostrils as needed.     Marland Kitchen ibuprofen (ADVIL,MOTRIN) 200 MG tablet Take 200 mg by mouth every 6 (six) hours as needed for headache, mild pain or moderate pain.     . metFORMIN (GLUCOPHAGE) 500 MG tablet Take 500 mg by mouth 2 (two) times daily with a meal.    . sertraline (ZOLOFT) 100 MG tablet Take 100 mg by mouth daily.    . traZODone (DESYREL) 50 MG tablet Take 50 mg by mouth at bedtime.     . VENTOLIN HFA 108 (90 BASE) MCG/ACT inhaler Inhale 2 puffs into the lungs as needed for wheezing or shortness of breath.      No current facility-administered medications for this visit.     PAST MEDICAL HISTORY: Past Medical History:  Diagnosis Date  . 1st degree AV block   . Allergy    seasonal  . Anxiety   . Anxiety and depression   . Asthma   . BPH (benign prostatic hyperplasia)   . Colon polyps    adenomatous  . Dysuria   . GERD (gastroesophageal reflux disease)    WATCHES DIET  . Hemangioma of liver   . Hematuria   . History of kidney stones   . Peripheral neuropathy   . Right ureteral stone   . Type 2 diabetes mellitus ([place])   . Ureterocele   . Urgency of urination   . Wears glasses  PAST SURGICAL HISTORY: Past Surgical History:  Procedure Laterality Date  . COLONOSCOPY    . CYSTOSCOPY/RETROGRADE/URETEROSCOPY Right 03/10/2013   Procedure: CYSTOSCOPY UNROOF URETEROCELE STONE EXTRACTION;  Surgeon: Irine Seal, MD;  [facility];  Service: Urology;  Laterality: Right;  . EXTRACORPOREAL SHOCK WAVE LITHOTRIPSY Right 10-17-2010    FAMILY HISTORY: Family History  Problem Relation Age of Onset  . Asthma Father        died at 97  . Healthy Mother        died of "old age" at 29  . Diabetes Brother   . Diabetes Sister   . Colon cancer Neg Hx      SOCIAL HISTORY:  Social History   Socioeconomic History  . Marital status: Married    Spouse name: Not on file  . Number of children: 2  . Years of education: PhD  . Highest education level: Not on file  Occupational History  . Occupation: retired professor  Social Needs  . Financial resource strain: Not on file  . Food insecurity:    Worry: Not on file    Inability: Not on file  . Transportation needs:    Medical: Not on file    Non-medical: Not on file  Tobacco Use  . Smoking status: Former Smoker    Packs/day: 0.25    Years: 20.00    Pack years: 5.00    Types: Cigarettes    Last attempt to quit: 1995    Years since quitting: 24.5  . Smokeless tobacco: Never Used  Substance and Sexual Activity  . Alcohol use: No    Alcohol/week: 0.0 oz  . Drug use: No  . Sexual activity: Not on file  Lifestyle  . Physical activity:    Days per week: Not on file    Minutes per session: Not on file  . Stress: Not on file  Relationships  . Social connections:    Talks on phone: Not on file    Gets together: Not on file    Attends religious service: Not on file    Active member of club or organization: Not on file    Attends meetings of clubs or organizations: Not on file    Relationship status: Not on file  . Intimate partner violence:    Fear of current or ex partner: Not on file    Emotionally abused: Not on file    Physically abused: Not on file    Forced sexual activity: Not on file  Other Topics Concern  . Not on file  Social History Narrative   Lives with his two sons.   Right-handed.   1 cup per day.     PHYSICAL EXAM   Vitals:   11/12/17 1412  BP: 139/75  Pulse: 64  Weight: 142 lb (64.4 kg)  Height: 5\' 8"  (1.727 m)    Not recorded      Body mass index is 21.59 kg/m.  PHYSICAL EXAMNIATION:  Gen: NAD, conversant, well nourised, obese, well groomed                     Cardiovascular: Regular rate rhythm, no peripheral edema, warm,  nontender. Eyes: Conjunctivae clear without exudates or hemorrhage Neck: Supple, no carotid bruits. Pulmonary: Clear to auscultation bilaterally   NEUROLOGICAL EXAM:  MENTAL STATUS: Speech:    Speech is normal; fluent and spontaneous with normal comprehension.  Cognition:     Orientation to time, place and person  Normal recent and remote memory     Normal Attention span and concentration     Normal Language, naming, repeating,spontaneous speech     Fund of knowledge   CRANIAL NERVES: CN II: Visual fields are full to confrontation. Fundoscopic exam is normal with sharp discs and no vascular changes. Pupils are round equal and briskly reactive to light. CN III, IV, VI: extraocular movement are normal. No ptosis. CN V: Facial sensation is intact to pinprick in all 3 divisions bilaterally. Corneal responses are intact.  CN VII: Face is symmetric with normal eye closure and smile. CN VIII: Hearing is normal to rubbing fingers CN IX, X: Palate elevates symmetrically. Phonation is normal. CN XI: Head turning and shoulder shrug are intact CN XII: Tongue is midline with normal movements and no atrophy.  MOTOR: There is no pronator drift of out-stretched arms. Muscle bulk and tone are normal. Muscle strength is normal.  REFLEXES: Reflexes are 2+ and symmetric at the biceps, triceps, knees, and trace at ankles. Plantar responses are flexor.  SENSORY: Length dependent decreased to light touch, pinprick, vibratory sensation at the ankle levels  COORDINATION: Rapid alternating movements and fine finger movements are intact. There is no dysmetria on finger-to-nose and heel-knee-shin.    GAIT/STANCE: Posture is normal. Gait is steady with normal steps, base, arm swing, and turning. Heel and toe walking are normal. Tandem gait is normal.  Romberg is absent.   DIAGNOSTIC DATA (LABS, IMAGING, TESTING) - I reviewed patient records, labs, notes, testing and imaging myself where  available.   ASSESSMENT AND PLAN  Mirko A Newstrom is a 72 y.o. male   Peripheral neuropathy  EMG nerve conduction study  A1c was 7.2,  Laboratory evaluation for other potential etiology  Try gabapentin 100 mg 3 times daily for symptomatic treatment   Marcial Pacas, M.D. Ph.D.  [facility] [address] [place],  [address] Ph: [phone] Fax: [phone]  CC: Deland Pretty, MD

## 2017-11-15 LAB — HEPATITIS PANEL, ACUTE
HEP A IGM: NEGATIVE
HEP B S AG: NEGATIVE
Hep B C IgM: NEGATIVE

## 2017-11-15 LAB — PROTEIN ELECTROPHORESIS
A/G Ratio: 1.4 (ref 0.7–1.7)
ALPHA 2: 0.9 g/dL (ref 0.4–1.0)
Albumin ELP: 4.2 g/dL (ref 2.9–4.4)
Alpha 1: 0.3 g/dL (ref 0.0–0.4)
BETA: 1 g/dL (ref 0.7–1.3)
GLOBULIN, TOTAL: 3 g/dL (ref 2.2–3.9)
Gamma Globulin: 0.9 g/dL (ref 0.4–1.8)
Total Protein: 7.2 g/dL (ref 6.0–8.5)

## 2017-11-15 LAB — RPR: RPR: NONREACTIVE

## 2017-11-15 LAB — HIV ANTIBODY (ROUTINE TESTING W REFLEX): HIV Screen 4th Generation wRfx: NONREACTIVE

## 2017-11-15 LAB — TSH: TSH: 7.44 u[IU]/mL — ABNORMAL HIGH (ref 0.450–4.500)

## 2017-11-15 LAB — CK: CK TOTAL: 131 U/L (ref 24–204)

## 2017-11-15 LAB — VITAMIN B12: Vitamin B-12: 329 pg/mL (ref 232–1245)

## 2017-11-15 LAB — ANA W/REFLEX IF POSITIVE: ANA: NEGATIVE

## 2017-11-15 LAB — C-REACTIVE PROTEIN: CRP: <1 mg/L (ref 0–10)

## 2017-11-16 ENCOUNTER — Telehealth: Payer: Self-pay | Admitting: *Deleted

## 2017-11-16 NOTE — Progress Notes (Signed)
Pls call pt: Labs look okay with the exception of low normal vitamin B12 level which should be monitored. Thyroid screening test called TSH was elevated, could indicate hypothyroidism, ie low thyroid function. I would recommend follow-up with primary care physician to discuss thyroid function and possible initiation of Rx with thyroid hormone.  Star Age, MD, PhD [facility])

## 2017-11-16 NOTE — Telephone Encounter (Signed)
Left message of both home and mobile numbers.

## 2017-11-16 NOTE — Telephone Encounter (Signed)
Left patient a detailed message on home and mobile numbers, with results (ok per DPR).  Provided our number to call back with any questions.

## 2017-11-16 NOTE — Telephone Encounter (Signed)
-----   Message from Star Age, MD sent at 11/16/2017  9:12 AM EDT ----- Pls call pt: Labs look okay with the exception of low normal vitamin B12 level which should be monitored. Thyroid screening test called TSH was elevated, could indicate hypothyroidism, ie low thyroid function. I would recommend follow-up with primary care physician to discuss thyroid function and possible initiation of Rx with thyroid hormone.  Star Age, MD, PhD [facility])

## 2018-01-11 ENCOUNTER — Other Ambulatory Visit: Payer: Self-pay | Admitting: Rheumatology

## 2018-01-11 DIAGNOSIS — G8929 Other chronic pain: Secondary | ICD-10-CM

## 2018-01-11 DIAGNOSIS — M545 Low back pain: Principal | ICD-10-CM

## 2018-01-20 ENCOUNTER — Ambulatory Visit
Admission: RE | Admit: 2018-01-20 | Discharge: 2018-01-20 | Disposition: A | Discharge status: Home / Self Care | Payer: Medicare Other | Source: Ambulatory Visit | Attending: Rheumatology | Admitting: Rheumatology

## 2018-01-20 DIAGNOSIS — M545 Low back pain: Principal | ICD-10-CM

## 2018-01-20 DIAGNOSIS — G8929 Other chronic pain: Secondary | ICD-10-CM

## 2018-01-20 MED ORDER — IOPAMIDOL (ISOVUE-M 200) INJECTION 41%
1.0000 mL | Freq: Once | INTRAMUSCULAR | Status: AC
  Administered 2018-01-20: 1 mL via EPIDURAL

## 2018-01-20 MED ORDER — METHYLPREDNISOLONE ACETATE 40 MG/ML INJ SUSP (RADIOLOG
120.0000 mg | Freq: Once | INTRAMUSCULAR | Status: AC
  Administered 2018-01-20: 120 mg via EPIDURAL

## 2018-01-20 NOTE — Discharge Instructions (Signed)

## 2018-01-26 ENCOUNTER — Other Ambulatory Visit: Payer: Medicare Other

## 2018-01-27 ENCOUNTER — Ambulatory Visit (INDEPENDENT_AMBULATORY_CARE_PROVIDER_SITE_OTHER): Payer: Medicare Other | Admitting: Neurology

## 2018-01-27 ENCOUNTER — Ambulatory Visit: Payer: Medicare Other | Admitting: Neurology

## 2018-01-27 ENCOUNTER — Telehealth: Payer: Self-pay | Admitting: Neurology

## 2018-01-27 DIAGNOSIS — G6289 Other specified polyneuropathies: Secondary | ICD-10-CM | POA: Diagnosis not present

## 2018-01-27 DIAGNOSIS — M5416 Radiculopathy, lumbar region: Secondary | ICD-10-CM

## 2018-01-27 MED ORDER — GABAPENTIN 300 MG PO CAPS: 300.0000 mg | ORAL_CAPSULE | Freq: Three times a day (TID) | ORAL | 11 refills | Status: DC

## 2018-01-27 NOTE — Procedures (Signed)
Full Name: Vincent Eaton Gender: Male MRN #: [id number] Date of Birth: 2045-11-05    Visit Date: 01/27/18 10:30 Age: 72 Years 1 Months Old Examining Physician: Marcial Pacas, MD  Referring Physician: Krista Blue, MD History: 72 year old male with few months history of bilateral feet paresthesia, recent onset of right low back pain, radiating pain to right hip, right lower extremity.  Summary of the tests:  Nerve conduction study: Bilateral sural sensory responses showed mildly prolonged peak latency with slightly decreased to snap amplitude.  Bilateral superficial peroneal sensory responses were absent.  Bilateral tibial motor responses were normal.  Left peroneal to EDB motor response showed normal distal latency, C map amplitude conduction velocity.  Right peroneal to EDB motor response showed moderately decreased the C map amplitude and conduction velocity.  Electromyography: Selective needle examinations were performed at right lower extremity muscles in the right lumbosacral paraspinal muscles.  There is evidence of chronic neuropathic changes involving right tibialis anterior, tibialis posterior, peroneal longus.  Conclusion: This is an abnormal study.  There is electrodiagnostic evidence of mildly less dependent peripheral neuropathy.  In addition, there is evidence of chronic right lumbosacral radiculopathy, mainly involving right L4-5 myotomes.    ------------------------------- Marcial Pacas, M.D. PhD  [facility] [place], [place] [address] Tel: [phone] Fax: [phone]        [facility]    Nerve / Sites Muscle Latency Ref. Amplitude Ref. Rel Amp Segments Distance Velocity Ref. Area    ms ms mV mV %  cm m/s m/s mVms  R Peroneal - EDB     Ankle EDB 5.1 ?6.5 0.5 ?2.0 100 Ankle - EDB 9   1.9     Fib head EDB 13.5  0.4  67.5 Fib head - Ankle 32 38 ?44 2.4     Pop fossa EDB 16.1  0.3  83.3 Pop fossa - Fib head 10 39 ?44 4.4         Pop fossa  - Ankle      L Peroneal - EDB     Ankle EDB 6.0 ?6.5 2.7 ?2.0 100 Ankle - EDB 9   9.9     Fib head EDB 13.9  2.4  89.2 Fib head - Ankle 32 40 ?44 9.4     Pop fossa EDB 16.5  2.2  92.1 Pop fossa - Fib head 10 39 ?44 9.2         Pop fossa - Ankle      R Tibial - AH     Ankle AH 4.9 ?5.8 4.2 ?4.0 100 Ankle - AH 9   9.3     Pop fossa AH 15.7  1.8  43.4 Pop fossa - Ankle 39 36 ?41 5.6  L Tibial - AH     Ankle AH 5.8 ?5.8 4.1 ?4.0 100 Ankle - AH 9   11.8     Pop fossa AH 16.8  2.0  50.2 Pop fossa - Ankle 39 36 ?41 6.1             SNC    Nerve / Sites Rec. Site Peak Lat Ref.  Amp Ref. Segments Distance    ms ms V V  cm  R Sural - Ankle (Calf)     Calf Ankle 4.4 ?4.4 5 ?6 Calf - Ankle 14  L Sural - Ankle (Calf)     Calf Ankle 4.6 ?4.4 4 ?6 Calf - Ankle 14  R Superficial peroneal -  Ankle     Lat leg Ankle NR ?4.4 NR ?6 Lat leg - Ankle 14  L Superficial peroneal - Ankle     Lat leg Ankle NR ?4.4 NR ?6 Lat leg - Ankle 14              F  Wave    Nerve F Lat Ref.   ms ms  R Tibial - AH 64.9 ?56.0  L Tibial - AH 64.4 ?56.0         EMG full       EMG Summary Table    Spontaneous MUAP Recruitment  Muscle IA Fib PSW Fasc Other Amp Dur. Poly Pattern  R. Tibialis anterior Normal None None None _______ Increased Normal Normal Reduced  R. Tibialis posterior Normal None None None _______ Normal Increased Normal Reduced  R. Peroneus longus Normal None None None _______ Normal Normal Normal Reduced  R. Gastrocnemius (Medial head) Normal None None None _______ Normal Normal Normal Reduced  R. Vastus lateralis Normal None None None _______ Normal Normal Normal Normal  R. Biceps femoris (short head) Normal None None None _______ Normal Normal Normal Normal  R. Lumbar paraspinals (mid) Normal None None None _______ Normal Normal Normal Normal  R. Lumbar paraspinals (low) Normal None None None _______ Normal Normal Normal Normal

## 2018-01-27 NOTE — Telephone Encounter (Signed)
[employer] Medicare order sent to [facility]. No auth they will reach out to the pt to schedule.  °

## 2018-01-27 NOTE — Progress Notes (Signed)
PATIENT: Vincent Eaton DOB: [birth year]  No chief complaint on file.    HISTORICAL  Vincent Eaton is a 72 year old male, seen in refer by his primary care physician Dr. Shelia Media, [facility] for evaluation of peripheral neuropathy, initial evaluation was on November 12, 2017  He had a history of diabetes since 2009 take Metformin, also suffered depression, gone through extreme stress,  Since 2010, he noticed gradual onset bilateral feet numbness, involving plantar surface and toes, gradually getting worse, sensitive to touch, even if he put on socks, he felt his feet sensitive, especially after bearing weight, prolonged walking, feet become more tender, but he denies gait abnormality, still able to run 3 miles few times each week at his age, he denies bowel and bladder incontinence, no bilateral upper extremity paresthesia,  Over the years, he has tried Lyrica, Cymbalta, all with limited help initially, but quickly lost its benefit,  He also complains of depression, insomnia going through extreme family stress.  MRI of lumbar spine in May 2016 showed lumbar spondylosis, degenerative disc disease, moderate impingement at L4-5, mild to moderate impingement at L5-S1,  MRI of cervical spine showed cervical spondylosis degenerative disc disease, causing moderate to prominent impingement at the C4-5 moderate at C3-4,  Laboratory evaluations in April 2018, normal CBC, hemoglobin of 13, A1C 7.2, CMP, creatinine 0.9, LDL was 92  UPDATE Sept 25 2019: I reviewed the laboratory evaluation in July 2019, normal or negative protein electrophoresis, C-reactive protein, ANA, otitis panel, CPK, HIV, RPR, B12, TSH was mildly elevated 7.4, he has self medicated himself with iodine supplement, now has stopped,  Since July 2019, he also noticed right-sided low back pain, radiating pain to right hip, right lower extremity, worsening bilateral feet paresthesia, gabapentin 100 mg 3 times daily  initially was helpful, now is less effective.  EMG nerve conduction study today showed mild peripheral neuropathy, with evidence of mild chronic right lumbosacral radiculopathy, mainly involving right L4-5 S1 myotomes.  REVIEW OF SYSTEMS: Full 14 system review of systems performed and notable only for sleepiness, wheezing not enough sleep, all rest of the review of the system were negative.  ALLERGIES: No Known Allergies  HOME MEDICATIONS: Current Outpatient Medications  Medication Sig Dispense Refill  . fluticasone (FLONASE) 50 MCG/ACT nasal spray Place 1 spray into both nostrils as needed.     . gabapentin (NEURONTIN) 100 MG capsule Take 1 capsule (100 mg total) by mouth 3 (three) times daily. 90 capsule 11  . ibuprofen (ADVIL,MOTRIN) 200 MG tablet Take 200 mg by mouth every 6 (six) hours as needed for headache, mild pain or moderate pain.     . metFORMIN (GLUCOPHAGE) 1000 MG tablet Take 1,000 mg by mouth 2 (two) times daily with a meal.    . metFORMIN (GLUCOPHAGE) 500 MG tablet Take 500 mg by mouth 2 (two) times daily with a meal.    . sertraline (ZOLOFT) 100 MG tablet Take 100 mg by mouth daily.    . traZODone (DESYREL) 50 MG tablet Take 50 mg by mouth at bedtime.     . VENTOLIN HFA 108 (90 BASE) MCG/ACT inhaler Inhale 2 puffs into the lungs as needed for wheezing or shortness of breath.      No current facility-administered medications for this visit.     PAST MEDICAL HISTORY: Past Medical History:  Diagnosis Date  . 1st degree AV block   . Allergy    seasonal  . Anxiety   . Anxiety and depression   .  Asthma   . BPH (benign prostatic hyperplasia)   . Colon polyps    adenomatous  . Dysuria   . GERD (gastroesophageal reflux disease)    WATCHES DIET  . Hemangioma of liver   . Hematuria   . History of kidney stones   . Peripheral neuropathy   . Right ureteral stone   . Type 2 diabetes mellitus ([place])   . Ureterocele   . Urgency of urination   . Wears glasses     PAST  SURGICAL HISTORY: Past Surgical History:  Procedure Laterality Date  . COLONOSCOPY    . CYSTOSCOPY/RETROGRADE/URETEROSCOPY Right 03/10/2013   Procedure: CYSTOSCOPY UNROOF URETEROCELE STONE EXTRACTION;  Surgeon: Irine Seal, MD;  Location: [facility];  Service: Urology;  Laterality: Right;  . EXTRACORPOREAL SHOCK WAVE LITHOTRIPSY Right 10-17-2010    FAMILY HISTORY: Family History  Problem Relation Age of Onset  . Asthma Father        died at 7  . Healthy Mother        died of "old age" at 24  . Diabetes Brother   . Diabetes Sister   . Colon cancer Neg Hx     SOCIAL HISTORY:  Social History   Socioeconomic History  . Marital status: Married    Spouse name: Not on file  . Number of children: 2  . Years of education: PhD  . Highest education level: Not on file  Occupational History  . Occupation: retired professor  Social Needs  . Financial resource strain: Not on file  . Food insecurity:    Worry: Not on file    Inability: Not on file  . Transportation needs:    Medical: Not on file    Non-medical: Not on file  Tobacco Use  . Smoking status: Former Smoker    Packs/day: 0.25    Years: 20.00    Pack years: 5.00    Types: Cigarettes    Last attempt to quit: 1995    Years since quitting: 24.7  . Smokeless tobacco: Never Used  Substance and Sexual Activity  . Alcohol use: No    Alcohol/week: 0.0 standard drinks  . Drug use: No  . Sexual activity: Not on file  Lifestyle  . Physical activity:    Days per week: Not on file    Minutes per session: Not on file  . Stress: Not on file  Relationships  . Social connections:    Talks on phone: Not on file    Gets together: Not on file    Attends religious service: Not on file    Active member of club or organization: Not on file    Attends meetings of clubs or organizations: Not on file    Relationship status: Not on file  . Intimate partner violence:    Fear of current or ex partner: Not on file     Emotionally abused: Not on file    Physically abused: Not on file    Forced sexual activity: Not on file  Other Topics Concern  . Not on file  Social History Narrative   Lives with his two sons.   Right-handed.   1 cup per day.     PHYSICAL EXAM   There were no vitals filed for this visit.  Not recorded      There is no height or weight on file to calculate BMI.  PHYSICAL EXAMNIATION:  Gen: NAD, conversant, well nourised, obese, well groomed  Cardiovascular: Regular rate rhythm, no peripheral edema, warm, nontender. Eyes: Conjunctivae clear without exudates or hemorrhage Neck: Supple, no carotid bruits. Pulmonary: Clear to auscultation bilaterally   NEUROLOGICAL EXAM:  MENTAL STATUS: Speech:    Speech is normal; fluent and spontaneous with normal comprehension.  Cognition:     Orientation to time, place and person     Normal recent and remote memory     Normal Attention span and concentration     Normal Language, naming, repeating,spontaneous speech     Fund of knowledge   CRANIAL NERVES: CN II: Visual fields are full to confrontation. Fundoscopic exam is normal with sharp discs and no vascular changes. Pupils are round equal and briskly reactive to light. CN III, IV, VI: extraocular movement are normal. No ptosis. CN V: Facial sensation is intact to pinprick in all 3 divisions bilaterally. Corneal responses are intact.  CN VII: Face is symmetric with normal eye closure and smile. CN VIII: Hearing is normal to rubbing fingers CN IX, X: Palate elevates symmetrically. Phonation is normal. CN XI: Head turning and shoulder shrug are intact CN XII: Tongue is midline with normal movements and no atrophy.  MOTOR: There is no pronator drift of out-stretched arms. Muscle bulk and tone are normal. Muscle strength is normal.  REFLEXES: Reflexes are 2+ and symmetric at the biceps, triceps, knees, and trace at ankles. Plantar responses are  flexor.  SENSORY: Length dependent decreased to light touch, pinprick, vibratory sensation at the ankle levels  COORDINATION: Rapid alternating movements and fine finger movements are intact. There is no dysmetria on finger-to-nose and heel-knee-shin.    GAIT/STANCE: Posture is normal. Gait is steady with normal steps, base, arm swing, and turning. Heel and toe walking are normal. Tandem gait is normal.  Romberg is absent.   DIAGNOSTIC DATA (LABS, IMAGING, TESTING) - I reviewed patient records, labs, notes, testing and imaging myself where available.   ASSESSMENT AND PLAN  Vincent Eaton is a 71 y.o. male   Peripheral neuropathy Right-sided low back pain, radiating pain to lower extremity,  Suggestive of right lumbosacral radiculopathy  Proceed with MRI of lumbar  Extensive laboratory evaluation showed mild elevated TSH, this will be followed up by his primary care physician,  Most likely his peripheral neuropathy due to diabetes, with A1c of 7.2,  Give him a higher dose of gabapentin 300 mg 3 times daily   Marcial Pacas, M.D. Ph.D.  [facility] [address], [place], [place] [address] Ph: [phone] Fax: [phone]  CC: Deland Pretty, MD

## 2018-01-31 ENCOUNTER — Ambulatory Visit
Admission: RE | Admit: 2018-01-31 | Discharge: 2018-01-31 | Disposition: A | Discharge status: Home / Self Care | Payer: Medicare Other | Source: Ambulatory Visit | Attending: Neurology | Admitting: Neurology

## 2018-01-31 DIAGNOSIS — M5416 Radiculopathy, lumbar region: Secondary | ICD-10-CM

## 2018-01-31 DIAGNOSIS — G6289 Other specified polyneuropathies: Secondary | ICD-10-CM

## 2018-02-02 ENCOUNTER — Telehealth: Payer: Self-pay | Admitting: Neurology

## 2018-02-02 NOTE — Telephone Encounter (Signed)
Left second messages, on both home and mobile numbers, requesting a call back to discuss his MRI results.

## 2018-02-02 NOTE — Telephone Encounter (Signed)
Spoke to patient - he is aware of his results and will keep his pending appt on 03/08/18 for further discussion with Dr. Krista Blue.

## 2018-02-02 NOTE — Telephone Encounter (Signed)
Left message, on both home and mobile numbers, requesting a call back to discuss his MRI results.

## 2018-02-02 NOTE — Telephone Encounter (Signed)
Please call patient, MRI of lumbar spine showed multilevel degenerative changes, evidence of variable degree of foraminal narrowing at different levels, I will review films at his next follow-up visit in November     IMPRESSION: This MRI of the lumbar spine shows multilevel degenerative changes as detailed above.  The most significant findings are: 1.    At L2-L3, there are degenerative changes causing moderately severe left lateral recess stenosis with potential for left L3 nerve root compression. 2.    At L3-L4, there is borderline spinal stenosis but there does not appear to be any nerve root compression. 3.    At L4-L5, there is mild spinal stenosis and moderately severe right foraminal and lateral recess stenosis.  Edema is noted in the endplates more to the right.  There is potential for right L4 and right L5 nerve root compression. 4.    At L5-S1, there is moderately severe left foraminal narrowing that could lead to compression of the left L5 nerve root. 5.    Degenerative changes have progressed when compared to the 2016 MRI.

## 2018-03-18 ENCOUNTER — Encounter: Payer: Self-pay | Admitting: Neurology

## 2018-03-18 ENCOUNTER — Ambulatory Visit: Payer: Medicare Other | Admitting: Neurology

## 2018-03-18 VITALS — BP 129/79 | HR 85 | Ht 68.0 in | Wt 147.0 lb

## 2018-03-18 DIAGNOSIS — G6289 Other specified polyneuropathies: Secondary | ICD-10-CM

## 2018-03-18 DIAGNOSIS — M5416 Radiculopathy, lumbar region: Secondary | ICD-10-CM

## 2018-03-18 MED ORDER — MELOXICAM 15 MG PO TABS: 15.0000 mg | ORAL_TABLET | Freq: Every day | ORAL | 11 refills | Status: DC | PRN

## 2018-03-18 NOTE — Progress Notes (Signed)
PATIENT: Vincent Eaton DOB: [birth year]  Chief Complaint  Patient presents with  . Polyneuropathy- Back/Leg Pain    He would like to review his MRI results.  He has continued taking gabapentin 300mg  TID but does not feel it controls his symptoms.  He is especially having difficulty sleeping at night.  He is still going to PT twice weekly.     HISTORICAL  Vincent Eaton is a 72 year old male, seen in refer by his primary care physician Dr. Shelia Media, [facility] Jew for evaluation of peripheral neuropathy, initial evaluation was on November 12, 2017  He had a history of diabetes since 2009 take Metformin, also suffered depression, gone through extreme stress,  Since 2010, he noticed gradual onset bilateral feet numbness, involving plantar surface and toes, gradually getting worse, sensitive to touch, even if he put on socks, he felt his feet sensitive, especially after bearing weight, prolonged walking, feet become more tender, but he denies gait abnormality, still able to run 3 miles few times each week at his age, he denies bowel and bladder incontinence, no bilateral upper extremity paresthesia,  Over the years, he has tried Lyrica, Cymbalta, all with limited help initially, but quickly lost its benefit,  He also complains of depression, insomnia going through extreme family stress.  MRI of lumbar spine in May 2016 showed lumbar spondylosis, degenerative disc disease, moderate impingement at L4-5, mild to moderate impingement at L5-S1,  MRI of cervical spine showed cervical spondylosis degenerative disc disease, causing moderate to prominent impingement at the C4-5 moderate at C3-4,  Laboratory evaluations in April 2018, normal CBC, hemoglobin of 13, A1C 7.2, CMP, creatinine 0.9, LDL was 92  UPDATE Sept 25 2019: I reviewed the laboratory evaluation in July 2019, normal or negative protein electrophoresis, C-reactive protein, ANA, otitis panel, CPK, HIV, RPR, B12, TSH was  mildly elevated 7.4, he has self medicated himself with iodine supplement, now has stopped,  Since July 2019, he also noticed right-sided low back pain, radiating pain to right hip, right lower extremity, worsening bilateral feet paresthesia, gabapentin 100 mg 3 times daily initially was helpful, now is less effective.  EMG nerve conduction study today showed mild peripheral neuropathy, with evidence of mild chronic right lumbosacral radiculopathy, mainly involving right L4-5 S1 myotomes.  UPDATE Mar 18 2018: He continue complains of worsening low back pain, radiating pain to right leg leg, numbnessa of right ankle and foot, worsening after bearing weight, denies similar involvement of left lower extremity, gait abnormality due to low back pain,  We have personally reviewed MRI of lumbar spine on February 01, 2018: Multilevel degenerative disc disease, most severe at L4-5, moderate severe right foraminal and lateral recess stenosis, edema is noted in the endplate more to the right side, L5-S1, moderately severe left foraminal narrowing, potential compression of left L5 nerve roots, progression since previous MRI in 2016   REVIEW OF SYSTEMS: Full 14 system review of systems performed and notable only for dizziness, numbness, joint pain, walking difficulty, ringing the ears.  All rest of the review of the system were negative.  ALLERGIES: No Known Allergies  HOME MEDICATIONS: Current Outpatient Medications  Medication Sig Dispense Refill  . fluticasone (FLONASE) 50 MCG/ACT nasal spray Place 1 spray into both nostrils as needed.     . gabapentin (NEURONTIN) 300 MG capsule Take 1 capsule (300 mg total) by mouth 3 (three) times daily. 90 capsule 11  . ibuprofen (ADVIL,MOTRIN) 200 MG tablet Take 200 mg by mouth every 6 (six)  hours as needed for headache, mild pain or moderate pain.     . metFORMIN (GLUCOPHAGE) 1000 MG tablet Take 1,000 mg by mouth 2 (two) times daily with a meal.    .  sertraline (ZOLOFT) 100 MG tablet Take 100 mg by mouth daily.    . traZODone (DESYREL) 50 MG tablet Take 50 mg by mouth at bedtime.     . VENTOLIN HFA 108 (90 BASE) MCG/ACT inhaler Inhale 2 puffs into the lungs as needed for wheezing or shortness of breath.      No current facility-administered medications for this visit.     PAST MEDICAL HISTORY: Past Medical History:  Diagnosis Date  . 1st degree AV block   . Allergy    seasonal  . Anxiety   . Anxiety and depression   . Asthma   . BPH (benign prostatic hyperplasia)   . Colon polyps    adenomatous  . Dysuria   . GERD (gastroesophageal reflux disease)    WATCHES DIET  . Hemangioma of liver   . Hematuria   . History of kidney stones   . Peripheral neuropathy   . Right ureteral stone   . Type 2 diabetes mellitus ([place])   . Ureterocele   . Urgency of urination   . Wears glasses     PAST SURGICAL HISTORY: Past Surgical History:  Procedure Laterality Date  . COLONOSCOPY    . CYSTOSCOPY/RETROGRADE/URETEROSCOPY Right 03/10/2013   Procedure: CYSTOSCOPY UNROOF URETEROCELE STONE EXTRACTION;  Surgeon: Irine Seal, MD;  Location: [facility];  Service: Urology;  Laterality: Right;  . EXTRACORPOREAL SHOCK WAVE LITHOTRIPSY Right 10-17-2010    FAMILY HISTORY: Family History  Problem Relation Age of Onset  . Asthma Father        died at 79  . Healthy Mother        died of "old age" at 44  . Diabetes Brother   . Diabetes Sister   . Colon cancer Neg Hx     SOCIAL HISTORY:  Social History   Socioeconomic History  . Marital status: Married    Spouse name: Not on file  . Number of children: 2  . Years of education: PhD  . Highest education level: Not on file  Occupational History  . Occupation: retired professor  Social Needs  . Financial resource strain: Not on file  . Food insecurity:    Worry: Not on file    Inability: Not on file  . Transportation needs:    Medical: Not on file    Non-medical: Not  on file  Tobacco Use  . Smoking status: Former Smoker    Packs/day: 0.25    Years: 20.00    Pack years: 5.00    Types: Cigarettes    Last attempt to quit: 1995    Years since quitting: 24.8  . Smokeless tobacco: Never Used  Substance and Sexual Activity  . Alcohol use: No    Alcohol/week: 0.0 standard drinks  . Drug use: No  . Sexual activity: Not on file  Lifestyle  . Physical activity:    Days per week: Not on file    Minutes per session: Not on file  . Stress: Not on file  Relationships  . Social connections:    Talks on phone: Not on file    Gets together: Not on file    Attends religious service: Not on file    Active member of club or organization: Not on file    Attends  meetings of clubs or organizations: Not on file    Relationship status: Not on file  . Intimate partner violence:    Fear of current or ex partner: Not on file    Emotionally abused: Not on file    Physically abused: Not on file    Forced sexual activity: Not on file  Other Topics Concern  . Not on file  Social History Narrative   Lives with his two sons.   Right-handed.   1 cup per day.     PHYSICAL EXAM   Vitals:   03/18/18 1049  BP: 129/79  Pulse: 85  Weight: 147 lb (66.7 kg)  Height: 5\' 8"  (1.727 m)    Not recorded      Body mass index is 22.35 kg/m.  PHYSICAL EXAMNIATION:  Gen: NAD, conversant, well nourised, obese, well groomed                     Cardiovascular: Regular rate rhythm, no peripheral edema, warm, nontender. Eyes: Conjunctivae clear without exudates or hemorrhage Neck: Supple, no carotid bruits. Pulmonary: Clear to auscultation bilaterally   NEUROLOGICAL EXAM:  MENTAL STATUS: Speech:    Speech is normal; fluent and spontaneous with normal comprehension.  Cognition:     Orientation to time, place and person     Normal recent and remote memory     Normal Attention span and concentration     Normal Language, naming, repeating,spontaneous speech     Fund  of knowledge   CRANIAL NERVES: CN II: Visual fields are full to confrontation. Fundoscopic exam is normal with sharp discs and no vascular changes. Pupils are round equal and briskly reactive to light. CN III, IV, VI: extraocular movement are normal. No ptosis. CN V: Facial sensation is intact to pinprick in all 3 divisions bilaterally. Corneal responses are intact.  CN VII: Face is symmetric with normal eye closure and smile. CN VIII: Hearing is normal to rubbing fingers CN IX, X: Palate elevates symmetrically. Phonation is normal. CN XI: Head turning and shoulder shrug are intact CN XII: Tongue is midline with normal movements and no atrophy.  MOTOR: There is no pronator drift of out-stretched arms. Muscle bulk and tone are normal. Muscle strength is normal.  REFLEXES: Reflexes are 2+ and symmetric at the biceps, triceps, knees, and trace at ankles. Plantar responses are flexor.  SENSORY: Length dependent decreased to light touch, pinprick, vibratory sensation at the ankle levels  COORDINATION: Rapid alternating movements and fine finger movements are intact. There is no dysmetria on finger-to-nose and heel-knee-shin.    GAIT/STANCE: Mildly antalgic, able to perform heel and toe walking and tandem walking   DIAGNOSTIC DATA (LABS, IMAGING, TESTING) - I reviewed patient records, labs, notes, testing and imaging myself where available.   ASSESSMENT AND PLAN  Vincent Eaton is a 72 y.o. male   Peripheral neuropathy  Most likely related to his diabetes with A1c of 7.2 Right lumbar radiculopathy  MRI in September 2019 showed multilevel degenerative changes, mostly severe at L4-5 L5-S1, evidence of moderate severe right foraminal narrowing, endplate edema  He has failed physical therapy, gabapentin 300 mg 3 times a day, frequent NSAIDs,  Refer him to pain management  Mobic 15 mg as needed   Marcial Pacas, M.D. Ph.D.  [facility] [address]  [place], [place] [address] Ph: [phone] Fax: [phone]  CC: Deland Pretty, MD

## 2018-11-18 ENCOUNTER — Telehealth: Payer: Self-pay | Admitting: Neurology

## 2018-11-18 NOTE — Telephone Encounter (Signed)
Pt has called to inform that he went to [facility] and was told by the Dr he saw that he doesn't do what Dr Krista Blue sent him there for. Pt states the Dr gave him an injection which did him no good.  Pt was told that Dr that he would refer him to Dr Vertell Limber.  Pt has not heard back from Dr Melven Sartorius office, pt then stated his primary Dr has suggested he follow up with Dr Krista Blue to see what she would suggest at this point, please call.

## 2018-11-22 NOTE — Telephone Encounter (Signed)
I have called Neurosurgery and left them a message waiting for a return call.

## 2018-11-23 NOTE — Telephone Encounter (Signed)
ERROR

## 2018-11-23 NOTE — Telephone Encounter (Signed)
Olvia called back from Dr. Melven Sartorius office  and she relayed to me that she will call patient today and get him another apt . I will call patient and tell him to be on the the look out for there call .

## 2019-01-28 ENCOUNTER — Other Ambulatory Visit: Payer: Self-pay | Admitting: Neurology

## 2019-02-04 ENCOUNTER — Other Ambulatory Visit: Payer: Self-pay | Admitting: Neurology

## 2019-04-28 ENCOUNTER — Other Ambulatory Visit: Payer: Self-pay | Admitting: Neurology

## 2019-05-26 DIAGNOSIS — E1139 Type 2 diabetes mellitus with other diabetic ophthalmic complication: Secondary | ICD-10-CM | POA: Diagnosis not present

## 2019-05-27 DIAGNOSIS — E0840 Diabetes mellitus due to underlying condition with diabetic neuropathy, unspecified: Secondary | ICD-10-CM | POA: Diagnosis not present

## 2019-05-27 DIAGNOSIS — H93A9 Pulsatile tinnitus, unspecified ear: Secondary | ICD-10-CM | POA: Diagnosis not present

## 2019-06-30 DIAGNOSIS — E1139 Type 2 diabetes mellitus with other diabetic ophthalmic complication: Secondary | ICD-10-CM | POA: Diagnosis not present

## 2019-06-30 DIAGNOSIS — J45909 Unspecified asthma, uncomplicated: Secondary | ICD-10-CM | POA: Diagnosis not present

## 2019-06-30 DIAGNOSIS — E0840 Diabetes mellitus due to underlying condition with diabetic neuropathy, unspecified: Secondary | ICD-10-CM | POA: Diagnosis not present

## 2019-08-11 ENCOUNTER — Ambulatory Visit: Payer: Medicare PPO | Attending: Internal Medicine

## 2019-08-11 DIAGNOSIS — Z23 Encounter for immunization: Secondary | ICD-10-CM

## 2019-08-11 NOTE — Progress Notes (Signed)
   Covid-19 Vaccination Clinic  Name:  Vincent Eaton    MRN: [id number] DOB: 1945/07/19  08/11/2019  [name] was observed post Covid-19 immunization for 15 minutes without incident. He was provided with Vaccine Information Sheet and instruction to access the V-Safe system.   [name] was instructed to call 911 with any severe reactions post vaccine: Marland Kitchen Difficulty breathing  . Swelling of face and throat  . A fast heartbeat  . A bad rash all over body  . Dizziness and weakness   Immunizations Administered    Name Date Dose VIS Date Route   Pfizer COVID-19 Vaccine 08/11/2019  2:50 PM 0.3 mL 04/15/2019 Intramuscular   Manufacturer: [place]   Lot: [id number]   [place]: [id number]

## 2019-08-17 DIAGNOSIS — E119 Type 2 diabetes mellitus without complications: Secondary | ICD-10-CM | POA: Diagnosis not present

## 2019-08-17 DIAGNOSIS — H2513 Age-related nuclear cataract, bilateral: Secondary | ICD-10-CM | POA: Diagnosis not present

## 2019-08-17 DIAGNOSIS — H52203 Unspecified astigmatism, bilateral: Secondary | ICD-10-CM | POA: Diagnosis not present

## 2019-08-22 ENCOUNTER — Other Ambulatory Visit: Payer: Self-pay | Admitting: Neurology

## 2019-08-24 ENCOUNTER — Other Ambulatory Visit: Payer: Self-pay | Admitting: Neurology

## 2019-08-28 ENCOUNTER — Other Ambulatory Visit: Payer: Self-pay | Admitting: Neurology

## 2019-09-19 ENCOUNTER — Ambulatory Visit: Payer: Medicare PPO | Attending: Internal Medicine

## 2019-09-19 DIAGNOSIS — Z23 Encounter for immunization: Secondary | ICD-10-CM

## 2019-09-19 NOTE — Progress Notes (Signed)
   Covid-19 Vaccination Clinic  Name:  Vincent Eaton    MRN: [id number] DOB: [birth year]  09/19/2019  Vincent Eaton was observed post Covid-19 immunization for 15 minutes without incident. He was provided with Vaccine Information Sheet and instruction to access the V-Safe system.   Vincent Eaton was instructed to call 911 with any severe reactions post vaccine: Marland Kitchen Difficulty breathing  . Swelling of face and throat  . A fast heartbeat  . A bad rash all over body  . Dizziness and weakness   Immunizations Administered    Name Date Dose VIS Date Route   Pfizer COVID-19 Vaccine 09/19/2019  2:58 PM 0.3 mL 06/29/2018 Intramuscular   Manufacturer: [place]   Lot: [id number]   [place]: [id number]

## 2019-09-22 DIAGNOSIS — Z125 Encounter for screening for malignant neoplasm of prostate: Secondary | ICD-10-CM | POA: Diagnosis not present

## 2019-09-22 DIAGNOSIS — D72819 Decreased white blood cell count, unspecified: Secondary | ICD-10-CM | POA: Diagnosis not present

## 2019-09-22 DIAGNOSIS — E1139 Type 2 diabetes mellitus with other diabetic ophthalmic complication: Secondary | ICD-10-CM | POA: Diagnosis not present

## 2019-09-27 DIAGNOSIS — I440 Atrioventricular block, first degree: Secondary | ICD-10-CM | POA: Diagnosis not present

## 2019-09-27 DIAGNOSIS — E1139 Type 2 diabetes mellitus with other diabetic ophthalmic complication: Secondary | ICD-10-CM | POA: Diagnosis not present

## 2019-09-27 DIAGNOSIS — F419 Anxiety disorder, unspecified: Secondary | ICD-10-CM | POA: Diagnosis not present

## 2019-09-27 DIAGNOSIS — Z791 Long term (current) use of non-steroidal anti-inflammatories (NSAID): Secondary | ICD-10-CM | POA: Diagnosis not present

## 2019-09-27 DIAGNOSIS — Z0001 Encounter for general adult medical examination with abnormal findings: Secondary | ICD-10-CM | POA: Diagnosis not present

## 2019-09-27 DIAGNOSIS — E1142 Type 2 diabetes mellitus with diabetic polyneuropathy: Secondary | ICD-10-CM | POA: Diagnosis not present

## 2019-09-27 DIAGNOSIS — G4700 Insomnia, unspecified: Secondary | ICD-10-CM | POA: Diagnosis not present

## 2019-09-27 DIAGNOSIS — J45909 Unspecified asthma, uncomplicated: Secondary | ICD-10-CM | POA: Diagnosis not present

## 2019-09-27 DIAGNOSIS — N401 Enlarged prostate with lower urinary tract symptoms: Secondary | ICD-10-CM | POA: Diagnosis not present

## 2019-11-28 DIAGNOSIS — M792 Neuralgia and neuritis, unspecified: Secondary | ICD-10-CM | POA: Diagnosis not present

## 2019-12-06 DIAGNOSIS — H9313 Tinnitus, bilateral: Secondary | ICD-10-CM | POA: Diagnosis not present

## 2019-12-06 DIAGNOSIS — H903 Sensorineural hearing loss, bilateral: Secondary | ICD-10-CM | POA: Diagnosis not present

## 2019-12-28 ENCOUNTER — Telehealth: Payer: Self-pay | Admitting: Neurology

## 2019-12-28 NOTE — Telephone Encounter (Signed)
This patient is a patient of Dr. Greer Pickerel, seen last for polyneuropathy and radiculopathy. They are requesting a provider switch to Dr. Brett Fairy. Please advise if this switch is acceptable.

## 2019-12-28 NOTE — Telephone Encounter (Signed)
This is a patient with neuropathy and back pain, 2 conditions a neuromuscular specialist like Dr. Krista Blue is excellently prepared to treat.   I am not accepting a transfer.

## 2020-09-06 ENCOUNTER — Encounter: Payer: Self-pay | Admitting: Internal Medicine

## 2020-10-02 DIAGNOSIS — Z125 Encounter for screening for malignant neoplasm of prostate: Secondary | ICD-10-CM | POA: Diagnosis not present

## 2020-10-02 DIAGNOSIS — E1139 Type 2 diabetes mellitus with other diabetic ophthalmic complication: Secondary | ICD-10-CM | POA: Diagnosis not present

## 2020-10-02 DIAGNOSIS — D72819 Decreased white blood cell count, unspecified: Secondary | ICD-10-CM | POA: Diagnosis not present

## 2020-10-03 DIAGNOSIS — J45909 Unspecified asthma, uncomplicated: Secondary | ICD-10-CM | POA: Diagnosis not present

## 2020-10-03 DIAGNOSIS — E1142 Type 2 diabetes mellitus with diabetic polyneuropathy: Secondary | ICD-10-CM | POA: Diagnosis not present

## 2020-10-03 DIAGNOSIS — Z0001 Encounter for general adult medical examination with abnormal findings: Secondary | ICD-10-CM | POA: Diagnosis not present

## 2020-10-03 DIAGNOSIS — Z86010 Personal history of colonic polyps: Secondary | ICD-10-CM | POA: Diagnosis not present

## 2020-10-03 DIAGNOSIS — J309 Allergic rhinitis, unspecified: Secondary | ICD-10-CM | POA: Diagnosis not present

## 2020-10-03 DIAGNOSIS — E0840 Diabetes mellitus due to underlying condition with diabetic neuropathy, unspecified: Secondary | ICD-10-CM | POA: Diagnosis not present

## 2020-10-03 DIAGNOSIS — E039 Hypothyroidism, unspecified: Secondary | ICD-10-CM | POA: Diagnosis not present

## 2020-10-04 ENCOUNTER — Other Ambulatory Visit: Payer: Self-pay | Admitting: Internal Medicine

## 2020-10-08 ENCOUNTER — Other Ambulatory Visit: Payer: Self-pay | Admitting: Internal Medicine

## 2020-11-22 DIAGNOSIS — H2513 Age-related nuclear cataract, bilateral: Secondary | ICD-10-CM | POA: Diagnosis not present

## 2020-11-22 DIAGNOSIS — H52203 Unspecified astigmatism, bilateral: Secondary | ICD-10-CM | POA: Diagnosis not present

## 2020-11-22 DIAGNOSIS — E119 Type 2 diabetes mellitus without complications: Secondary | ICD-10-CM | POA: Diagnosis not present

## 2021-01-08 DIAGNOSIS — E039 Hypothyroidism, unspecified: Secondary | ICD-10-CM | POA: Diagnosis not present

## 2021-01-08 DIAGNOSIS — E1139 Type 2 diabetes mellitus with other diabetic ophthalmic complication: Secondary | ICD-10-CM | POA: Diagnosis not present

## 2021-01-09 DIAGNOSIS — E039 Hypothyroidism, unspecified: Secondary | ICD-10-CM | POA: Diagnosis not present

## 2021-01-09 DIAGNOSIS — E1139 Type 2 diabetes mellitus with other diabetic ophthalmic complication: Secondary | ICD-10-CM | POA: Diagnosis not present

## 2021-01-28 ENCOUNTER — Other Ambulatory Visit: Payer: Self-pay | Admitting: Internal Medicine

## 2021-01-28 DIAGNOSIS — E1139 Type 2 diabetes mellitus with other diabetic ophthalmic complication: Secondary | ICD-10-CM

## 2021-01-28 DIAGNOSIS — IMO0002 Reserved for concepts with insufficient information to code with codable children: Secondary | ICD-10-CM

## 2021-05-05 ENCOUNTER — Emergency Department (HOSPITAL_BASED_OUTPATIENT_CLINIC_OR_DEPARTMENT_OTHER)
Admission: EM | Admit: 2021-05-05 | Discharge: 2021-05-05 | Disposition: A | Discharge status: Home / Self Care | Payer: Medicare PPO | Attending: Emergency Medicine | Admitting: Emergency Medicine

## 2021-05-05 ENCOUNTER — Other Ambulatory Visit: Payer: Self-pay

## 2021-05-05 ENCOUNTER — Emergency Department (HOSPITAL_BASED_OUTPATIENT_CLINIC_OR_DEPARTMENT_OTHER): Payer: Medicare PPO

## 2021-05-05 DIAGNOSIS — E1140 Type 2 diabetes mellitus with diabetic neuropathy, unspecified: Secondary | ICD-10-CM | POA: Diagnosis not present

## 2021-05-05 DIAGNOSIS — R1111 Vomiting without nausea: Secondary | ICD-10-CM | POA: Diagnosis not present

## 2021-05-05 DIAGNOSIS — K529 Noninfective gastroenteritis and colitis, unspecified: Secondary | ICD-10-CM | POA: Insufficient documentation

## 2021-05-05 DIAGNOSIS — Z7984 Long term (current) use of oral hypoglycemic drugs: Secondary | ICD-10-CM | POA: Diagnosis not present

## 2021-05-05 DIAGNOSIS — R109 Unspecified abdominal pain: Secondary | ICD-10-CM | POA: Diagnosis not present

## 2021-05-05 DIAGNOSIS — R112 Nausea with vomiting, unspecified: Secondary | ICD-10-CM | POA: Diagnosis not present

## 2021-05-05 DIAGNOSIS — R9431 Abnormal electrocardiogram [ECG] [EKG]: Secondary | ICD-10-CM | POA: Diagnosis not present

## 2021-05-05 DIAGNOSIS — R1110 Vomiting, unspecified: Secondary | ICD-10-CM | POA: Diagnosis not present

## 2021-05-05 LAB — CBC WITH DIFFERENTIAL/PLATELET
Abs Immature Granulocytes: 0.04 10*3/uL (ref 0.00–0.07)
Basophils Absolute: 0 10*3/uL (ref 0.0–0.1)
Basophils Relative: 0 %
Eosinophils Absolute: 0.1 10*3/uL (ref 0.0–0.5)
Eosinophils Relative: 1 %
HCT: 40.7 % (ref 39.0–52.0)
Hemoglobin: 13.1 g/dL (ref 13.0–17.0)
Immature Granulocytes: 1 %
Lymphocytes Relative: 6 %
Lymphs Abs: 0.4 10*3/uL — ABNORMAL LOW (ref 0.7–4.0)
MCH: 27.8 pg (ref 26.0–34.0)
MCHC: 32.2 g/dL (ref 30.0–36.0)
MCV: 86.2 fL (ref 80.0–100.0)
Monocytes Absolute: 0.4 10*3/uL (ref 0.1–1.0)
Monocytes Relative: 6 %
Neutro Abs: 5.6 10*3/uL (ref 1.7–7.7)
Neutrophils Relative %: 86 %
Platelets: 223 10*3/uL (ref 150–400)
RBC: 4.72 MIL/uL (ref 4.22–5.81)
RDW: 14.3 % (ref 11.5–15.5)
WBC: 6.5 10*3/uL (ref 4.0–10.5)
nRBC: 0 % (ref 0.0–0.2)

## 2021-05-05 LAB — URINALYSIS, ROUTINE W REFLEX MICROSCOPIC
Bilirubin Urine: NEGATIVE
Glucose, UA: >1000 mg/dL — AB
Hgb urine dipstick: NEGATIVE
Ketones, ur: 15 mg/dL — AB
Leukocytes,Ua: NEGATIVE
Nitrite: NEGATIVE
Protein, ur: NEGATIVE mg/dL
Specific Gravity, Urine: 1.034 — ABNORMAL HIGH (ref 1.005–1.030)
pH: 5 (ref 5.0–8.0)

## 2021-05-05 LAB — COMPREHENSIVE METABOLIC PANEL
ALT: 11 U/L (ref 0–44)
AST: 21 U/L (ref 15–41)
Albumin: 4.8 g/dL (ref 3.5–5.0)
Alkaline Phosphatase: 58 U/L (ref 38–126)
Anion gap: 12 (ref 5–15)
BUN: 32 mg/dL — ABNORMAL HIGH (ref 8–23)
CO2: 25 mmol/L (ref 22–32)
Calcium: 9.5 mg/dL (ref 8.9–10.3)
Chloride: 103 mmol/L (ref 98–111)
Creatinine, Ser: 1.09 mg/dL (ref 0.61–1.24)
GFR, Estimated: >60 mL/min (ref >60)
Glucose, Bld: 169 mg/dL — ABNORMAL HIGH (ref 70–99)
Potassium: 3.8 mmol/L (ref 3.5–5.1)
Sodium: 140 mmol/L (ref 135–145)
Total Bilirubin: 0.4 mg/dL (ref 0.3–1.2)
Total Protein: 7.5 g/dL (ref 6.5–8.1)

## 2021-05-05 LAB — CBG MONITORING, ED: Glucose-Capillary: 125 mg/dL — ABNORMAL HIGH (ref 70–99)

## 2021-05-05 LAB — TROPONIN I (HIGH SENSITIVITY)
Troponin I (High Sensitivity): <2 ng/L (ref <18)
Troponin I (High Sensitivity): 2 ng/L (ref <18)

## 2021-05-05 LAB — LIPASE, BLOOD: Lipase: 27 U/L (ref 11–51)

## 2021-05-05 MED ORDER — ONDANSETRON HCL 4 MG/2ML IJ SOLN
4.0000 mg | Freq: Once | INTRAMUSCULAR | Status: AC
  Administered 2021-05-05: 4 mg via INTRAVENOUS
  Filled 2021-05-05: qty 2

## 2021-05-05 MED ORDER — SODIUM CHLORIDE 0.9 % IV BOLUS
1000.0000 mL | Freq: Once | INTRAVENOUS | Status: AC
  Administered 2021-05-05: 1000 mL via INTRAVENOUS

## 2021-05-05 MED ORDER — ONDANSETRON 4 MG PO TBDP: 4.0000 mg | ORAL_TABLET | Freq: Three times a day (TID) | ORAL | 0 refills | Status: DC | PRN

## 2021-05-05 MED ORDER — IOHEXOL 300 MG/ML  SOLN
100.0000 mL | Freq: Once | INTRAMUSCULAR | Status: AC | PRN
  Administered 2021-05-05: 100 mL via INTRAVENOUS

## 2021-05-05 NOTE — ED Notes (Signed)
Patient transported to CT 

## 2021-05-05 NOTE — ED Triage Notes (Signed)
Pt presents to ED via EMS from home with report of sudden onset N/V/D and mid, upper abdominal cramping since 2200. Last BM 30 minutes PTA (diarrhea), denies CP, SOB, dizziness, blood in stool or vomit, or urinary problems. VSS, A&Ox3, vomiting upon arrival.

## 2021-05-05 NOTE — ED Provider Notes (Signed)
[facility] EMERGENCY DEPT Provider Note   CSN: [id number] Arrival date & time: 05/05/21  0114     History  Chief Complaint  Patient presents with   Emesis    Vincent Eaton is a 76 y.o. male.  Patient with a history of diabetes, GERD, anxiety, peripheral neuropathy presenting by EMS with nausea, vomiting and diarrhea that onset acutely at 10:30 PM.  States he ate some collard greens and cabbage around 7 PM and was doing well earlier in the day.  He started having abdominal cramps with multiple episodes of nonbloody nonbilious emesis times about 4 before calling EMS.  Had several loose stools as well that were nonbloody.  Complains of diffuse abdominal cramping.  No fever.  No recent travel or sick contacts.  No recent antibiotic use.  No chest pain or shortness of breath.  States he was doing well prior to this evening about 10:00 and has had persistent vomiting and diarrhea since. No black or bloody stools. Still has appendix and gallbladder.  The history is provided by the patient and the EMS personnel.  Emesis Associated symptoms: abdominal pain and diarrhea   Associated symptoms: no arthralgias, no cough, no fever, no headaches and no myalgias       Home Medications Prior to Admission medications   Medication Sig Start Date End Date Taking? Authorizing Provider  fluticasone (FLONASE) 50 MCG/ACT nasal spray Place 1 spray into both nostrils as needed.  11/21/13   [provider]  gabapentin (NEURONTIN) 300 MG capsule Take 1 capsule (300 mg total) by mouth 3 (three) times daily. Please call [phone] to schedule yearly appt or may request refills from PCP. 01/30/19   Marcial Pacas, MD  ibuprofen (ADVIL,MOTRIN) 200 MG tablet Take 200 mg by mouth every 6 (six) hours as needed for headache, mild pain or moderate pain.     [provider]  meloxicam (MOBIC) 15 MG tablet Take 1 tablet (15 mg total) by mouth daily as needed for pain. 03/18/18   Marcial Pacas, MD  metFORMIN (GLUCOPHAGE) 1000 MG tablet Take 1,000 mg by mouth 2 (two) times daily with a meal.    [provider]  sertraline (ZOLOFT) 100 MG tablet Take 100 mg by mouth daily.    [provider]  traZODone (DESYREL) 50 MG tablet Take 50 mg by mouth at bedtime.     [provider]  VENTOLIN HFA 108 (90 BASE) MCG/ACT inhaler Inhale 2 puffs into the lungs as needed for wheezing or shortness of breath.  12/21/13   [provider]      Allergies    Patient has no known allergies.    Review of Systems   Review of Systems  Constitutional:  Positive for activity change and appetite change. Negative for fatigue and fever.  HENT:  Negative for congestion and rhinorrhea.   Respiratory:  Negative for cough, chest tightness and shortness of breath.   Cardiovascular:  Negative for chest pain.  Gastrointestinal:  Positive for abdominal pain, diarrhea, nausea and vomiting.  Genitourinary:  Negative for dysuria and hematuria.  Musculoskeletal:  Negative for arthralgias and myalgias.  Skin:  Negative for rash.  Neurological:  Negative for dizziness, weakness and headaches.   all other systems are negative except as noted in the HPI and PMH.   Physical Exam Updated Vital Signs BP (!) 136/58 (BP Location: Right Arm)    Pulse 78    Temp 98.3 F (36.8 C) (Oral)    Resp 20  Ht 5\' 8"  (1.727 m)    Wt 59 kg    SpO2 99%    BMI 19.77 kg/m  Physical Exam Vitals and nursing note reviewed.  Constitutional:      General: He is not in acute distress.    Appearance: He is well-developed.  HENT:     Head: Normocephalic and atraumatic.     Mouth/Throat:     Pharynx: No oropharyngeal exudate.  Eyes:     Conjunctiva/sclera: Conjunctivae normal.     Pupils: Pupils are equal, round, and reactive to light.  Neck:     Comments: No meningismus. Cardiovascular:     Rate and Rhythm: Normal rate and regular rhythm.     Heart sounds: Normal heart sounds. No murmur  heard. Pulmonary:     Effort: Pulmonary effort is normal. No respiratory distress.     Breath sounds: Normal breath sounds.  Abdominal:     Palpations: Abdomen is soft.     Tenderness: There is abdominal tenderness. There is no guarding or rebound.     Comments: Mild diffuse tenderness, no guarding or rebound  Musculoskeletal:        General: No tenderness. Normal range of motion.     Cervical back: Normal range of motion and neck supple.  Skin:    General: Skin is warm.  Neurological:     Mental Status: He is alert and oriented to person, place, and time.     Cranial Nerves: No cranial nerve deficit.     Motor: No abnormal muscle tone.     Coordination: Coordination normal.     Comments:  5/5 strength throughout. CN 2-12 intact.Equal grip strength.   Psychiatric:        Behavior: Behavior normal.    ED Results / Procedures / Treatments   Labs (all labs ordered are listed, but only abnormal results are displayed) Labs Reviewed  CBC WITH DIFFERENTIAL/PLATELET - Abnormal; Notable for the following components:      Result Value   Lymphs Abs 0.4 (*)    All other components within normal limits  COMPREHENSIVE METABOLIC PANEL - Abnormal; Notable for the following components:   Glucose, Bld 169 (*)    BUN 32 (*)    All other components within normal limits  URINALYSIS, ROUTINE W REFLEX MICROSCOPIC - Abnormal; Notable for the following components:   Specific Gravity, Urine 1.034 (*)    Glucose, UA >1,000 (*)    Ketones, ur 15 (*)    Bacteria, UA RARE (*)    All other components within normal limits  CBG MONITORING, ED - Abnormal; Notable for the following components:   Glucose-Capillary 125 (*)    All other components within normal limits  LIPASE, BLOOD  TROPONIN I (HIGH SENSITIVITY)  TROPONIN I (HIGH SENSITIVITY)    EKG EKG Interpretation  Date/Time:  Sunday May 05 2021 01:44:24 EST Ventricular Rate:  77 PR Interval:  210 QRS Duration: 80 QT Interval:  371 QTC  Calculation: 420 R Axis:   79 Text Interpretation: Sinus rhythm Minimal ST elevation, inferior leads No significant change was found Confirmed by Ezequiel Essex [phone]) on 05/05/2021 2:23:55 AM  Radiology CT ABDOMEN PELVIS W CONTRAST  Result Date: 05/05/2021 CLINICAL DATA:  Nausea, vomiting, mid abdominal pain EXAM: CT ABDOMEN AND PELVIS WITH CONTRAST TECHNIQUE: Multidetector CT imaging of the abdomen and pelvis was performed using the standard protocol following bolus administration of intravenous contrast. CONTRAST:  15mL OMNIPAQUE IOHEXOL 300 MG/ML  SOLN COMPARISON:  None. FINDINGS: Lower  chest: The visualized lung bases are clear. The visualized heart and pericardium are unremarkable. Hepatobiliary: Stable 12 mm hypodensity within the left hepatic lobe possibly representing a tiny hemangioma or cyst. Liver otherwise unremarkable. No intra or extrahepatic biliary ductal dilation. Gallbladder unremarkable. Pancreas: Unremarkable Spleen: Unremarkable Adrenals/Urinary Tract: Adrenal glands are unremarkable. Kidneys are normal, without renal calculi, focal lesion, or hydronephrosis. Bladder is unremarkable. Stomach/Bowel: Stomach is within normal limits. Appendix appears normal. No evidence of bowel wall thickening, distention, or inflammatory changes. No free intraperitoneal gas or fluid. Vascular/Lymphatic: Aortic atherosclerosis. No enlarged abdominal or pelvic lymph nodes. Reproductive: Mild prostatic enlargement. Other: No abdominal wall hernia.  Rectum unremarkable. Musculoskeletal: Degenerative changes are seen within the lumbar spine. No acute bone abnormality. IMPRESSION: No acute intra-abdominal pathology identified. No definite radiographic explanation for the patient's reported symptoms. Aortic Atherosclerosis (ICD10-I70.0). Electronically Signed   By: Fidela Salisbury M.D.   On: 05/05/2021 03:53    Procedures Procedures    Medications Ordered in ED Medications  ondansetron (ZOFRAN) injection  4 mg (has no administration in time range)  sodium chloride 0.9 % bolus 1,000 mL (has no administration in time range)    ED Course/ Medical Decision Making/ A&P                           Medical Decision Making 3 hours of abdominal cramping with nausea, vomiting and diarrhea.  Vital stable, no distress, no peritoneal signs on exam.  Will hydrate, obtain labs and give symptom control.  Favor likely viral gastroenteritis or foodborne illness  Labs are reassuring.  Normal LFTs and lipase.  No leukocytosis.  Troponin negative.  Low suspicion for atypical ACS.  EKG nonischemic  Abdomen soft without peritoneal signs. No further vomiting or diarrhea in the ED. CT scan negative for acute pathology.  Images personally reviewed. Troponin negative x2.  No chest pain.  Suspect likely viral gastroenteritis or foodborne illness. Patient able to tolerate p.o. without further vomiting or diarrhea in the ED.  Abdomen is soft without peritoneal signs. Discussed symptomatic control at home, for liquid diet, advance diet slowly as tolerated.  Return to the ED with worsening pain especially to the right lower quadrant, persistent fever, persistent vomiting, not able to eat or drink or any other concerns.          Final Clinical Impression(s) / ED Diagnoses Final diagnoses:  Gastroenteritis    Rx / DC Orders ED Discharge Orders     None         Mazi Brailsford, Annie Main, MD 05/05/21 [phone]

## 2021-05-05 NOTE — Discharge Instructions (Addendum)
Start with a clear liquid diet for the next 24 hours and advance her diet slowly as you are feeling better.  Take the nausea medication as prescribed.  Avoid alcohol, caffeine, spicy foods, carbonated beverages and NSAID medications.  Keep yourself hydrated.  Follow-up with your doctor. Return to the ED with worsening symptoms including right-sided abdominal pain, fever, intractable vomiting, intractable diarrhea, any other concerns.

## 2021-07-02 DIAGNOSIS — E1139 Type 2 diabetes mellitus with other diabetic ophthalmic complication: Secondary | ICD-10-CM | POA: Diagnosis not present

## 2021-07-02 DIAGNOSIS — E039 Hypothyroidism, unspecified: Secondary | ICD-10-CM | POA: Diagnosis not present

## 2021-07-09 DIAGNOSIS — R030 Elevated blood-pressure reading, without diagnosis of hypertension: Secondary | ICD-10-CM | POA: Diagnosis not present

## 2021-07-09 DIAGNOSIS — E1139 Type 2 diabetes mellitus with other diabetic ophthalmic complication: Secondary | ICD-10-CM | POA: Diagnosis not present

## 2021-07-09 DIAGNOSIS — E039 Hypothyroidism, unspecified: Secondary | ICD-10-CM | POA: Diagnosis not present

## 2021-07-09 DIAGNOSIS — E1141 Type 2 diabetes mellitus with diabetic mononeuropathy: Secondary | ICD-10-CM | POA: Diagnosis not present

## 2021-08-21 DIAGNOSIS — M792 Neuralgia and neuritis, unspecified: Secondary | ICD-10-CM | POA: Diagnosis not present

## 2021-08-21 DIAGNOSIS — H93A1 Pulsatile tinnitus, right ear: Secondary | ICD-10-CM | POA: Diagnosis not present

## 2021-08-21 DIAGNOSIS — B070 Plantar wart: Secondary | ICD-10-CM | POA: Diagnosis not present

## 2021-10-03 DIAGNOSIS — E039 Hypothyroidism, unspecified: Secondary | ICD-10-CM | POA: Diagnosis not present

## 2021-10-03 DIAGNOSIS — E1139 Type 2 diabetes mellitus with other diabetic ophthalmic complication: Secondary | ICD-10-CM | POA: Diagnosis not present

## 2021-10-03 DIAGNOSIS — E875 Hyperkalemia: Secondary | ICD-10-CM | POA: Diagnosis not present

## 2021-10-03 DIAGNOSIS — G629 Polyneuropathy, unspecified: Secondary | ICD-10-CM | POA: Diagnosis not present

## 2021-10-03 DIAGNOSIS — Z125 Encounter for screening for malignant neoplasm of prostate: Secondary | ICD-10-CM | POA: Diagnosis not present

## 2021-10-08 DIAGNOSIS — Z23 Encounter for immunization: Secondary | ICD-10-CM | POA: Diagnosis not present

## 2021-10-08 DIAGNOSIS — Z1212 Encounter for screening for malignant neoplasm of rectum: Secondary | ICD-10-CM | POA: Diagnosis not present

## 2021-10-08 DIAGNOSIS — E039 Hypothyroidism, unspecified: Secondary | ICD-10-CM | POA: Diagnosis not present

## 2021-10-08 DIAGNOSIS — N401 Enlarged prostate with lower urinary tract symptoms: Secondary | ICD-10-CM | POA: Diagnosis not present

## 2021-10-08 DIAGNOSIS — R052 Subacute cough: Secondary | ICD-10-CM | POA: Diagnosis not present

## 2021-10-08 DIAGNOSIS — G629 Polyneuropathy, unspecified: Secondary | ICD-10-CM | POA: Diagnosis not present

## 2021-10-08 DIAGNOSIS — I440 Atrioventricular block, first degree: Secondary | ICD-10-CM | POA: Diagnosis not present

## 2021-10-08 DIAGNOSIS — M5416 Radiculopathy, lumbar region: Secondary | ICD-10-CM | POA: Diagnosis not present

## 2021-10-08 DIAGNOSIS — Z0000 Encounter for general adult medical examination without abnormal findings: Secondary | ICD-10-CM | POA: Diagnosis not present

## 2021-10-08 DIAGNOSIS — E1139 Type 2 diabetes mellitus with other diabetic ophthalmic complication: Secondary | ICD-10-CM | POA: Diagnosis not present

## 2021-12-06 ENCOUNTER — Encounter: Payer: Self-pay | Admitting: Neurology

## 2021-12-09 DIAGNOSIS — G629 Polyneuropathy, unspecified: Secondary | ICD-10-CM | POA: Diagnosis not present

## 2021-12-09 DIAGNOSIS — H9313 Tinnitus, bilateral: Secondary | ICD-10-CM | POA: Diagnosis not present

## 2022-01-21 DIAGNOSIS — G629 Polyneuropathy, unspecified: Secondary | ICD-10-CM | POA: Diagnosis not present

## 2022-01-21 DIAGNOSIS — N529 Male erectile dysfunction, unspecified: Secondary | ICD-10-CM | POA: Diagnosis not present

## 2022-05-13 NOTE — Progress Notes (Unsigned)
NEUROLOGY CONSULTATION NOTE  Vincent Eaton MRN: [id number] DOB: [birth year]  Referring provider: Deland Pretty, MD Primary care provider: Deland Pretty, MD  Reason for consult:  headache, pulsatile tinnitus, neuropathy  Assessment/Plan:   Right sided pulsatile tinnitus  Bilateral tinnitus - unfortunately, there isn't anything that can be done for this except for cognitive behavioral therapy. Right sided lumbosacral radiculopathy Diabetic polyneuropathy   Will check CTA head and neck to evaluate for vascular etiology of pulsatile tinnitus Unfortunately, he has already exhausted the medications that I would prescribe for treating sciatica and polyneuropathy.  Refer to pain management    Subjective:  Vincent Eaton is a 77 year old male with DM 2, 1st degree AV block, depression, anxiety and history of kidney stones who presents for headache, pulsatile tinnitus and neuropathy.  History supplemented by prior neurologists' and referring provider's notes.  Tinnitus: Reports bilateral tinnitus since 1995.  At the time, he was told it was due to antibiotics for an ear infection.  It is a high-pitched tone but also hears his heart beat.  Progressively gotten worse over time.  Right worse than left.  Notes some difficulty hearing.    Headache: Started about 2013.  Right sided headache described as pressure or "itching" sensation.  Feels a stabbing sensation sometimes when he presses behind or in front of his ear.  Has some chronic right sided neck pain.  MRI cervical spine on 09/14/2014 showed cervical spondylosis with moderate to prominent impingement at C4-5, moderate at C3-4 and mild impingement at C2-3, C5-6 and C6-7. He has had ESI of C7-T1 in the past.  He says it doesn't really bother him too much.  His main concern is the tinnitus.  Peripheral Neuropathy: He was diagnosed with diabetes in 2009.  In 2010, he started experiencing numbness and tingling in both feet that  gradually progressed to pain up the legs.  Also has right sided sciatica.  NCV-EMG on 01/27/2018 revealed evidence of mild length dependent polyneuropathy with superimposed chronic right L4-5 radiculopathy.  MRI of lumbar spine on 01/31/2018 personally reviewed revealed multilevel lumbar spondylosis with moderately severe left lateral recess stenosis at L2-L3 potentially affecting left L3 nerve root, borderline spinal stenosis at L3-L4, mild spinal stenosis and moderately severe right foraminal and lateral stenosis at L4-L5 with endplate edema potentially affecting right L4 and L5 nerve roots, and moderately severe left foraminal narrowing at L5-S1 potentially effecting the left L5 nerve root. ESI and physical therpay not too effective.  Previous labs from 2017 and 2019 include negative ANA, sed rate 1, negative CRP, negative RF, B12 329, non-reactive RPR, negative HIV, CK 131, negative SPEP, negative CCP antibody, negative HLA-B27 antigen, negative hepatitis panel, elevated TSH 7.440.  Currently treated for hypothyroidism.  Recent Hgb A1c results include 6.8 (07/02/2021), 6.7 (01/08/2021) and 7.6 (10/02/2020).  Previously did well on gabapentin but then lost efficacy.  Last year, switched to Lyrica but overall not helpful.  Some days worse than others.  Previously took Cymbalta and TCA which provided little benefit.  Exercise and heat helps.    Current NSAIDS/analgesics:  Advil Current Antidepressant medications:  sertraline '100mg'$  daily, trazodone 75-'100mg'$  QHS (insomnia) Current Anticonvulsant medications:  Lyrica '100mg'$  three times daily     Past antidepressant medications:  Cymbalta, TCA Past anticonvulsant medications:  gabapentin '300mg'$  TID     PAST MEDICAL HISTORY: Past Medical History:  Diagnosis Date   1st degree AV block    Allergy    seasonal   Anxiety  Anxiety and depression    Asthma    BPH (benign prostatic hyperplasia)    Colon polyps    adenomatous   Dysuria    GERD  (gastroesophageal reflux disease)    WATCHES DIET   Hemangioma of liver    Hematuria    History of kidney stones    Peripheral neuropathy    Right ureteral stone    Type 2 diabetes mellitus ([place])    Ureterocele    Urgency of urination    Wears glasses     PAST SURGICAL HISTORY: Past Surgical History:  Procedure Laterality Date   COLONOSCOPY     CYSTOSCOPY/RETROGRADE/URETEROSCOPY Right 03/10/2013   Procedure: CYSTOSCOPY UNROOF URETEROCELE STONE EXTRACTION;  Surgeon: Irine Seal, MD;  Location: [facility];  Service: Urology;  Laterality: Right;   EXTRACORPOREAL SHOCK WAVE LITHOTRIPSY Right 10-17-2010    MEDICATIONS: Current Outpatient Medications on File Prior to Visit  Medication Sig Dispense Refill   fluticasone (FLONASE) 50 MCG/ACT nasal spray Place 1 spray into both nostrils as needed.      gabapentin (NEURONTIN) 300 MG capsule Take 1 capsule (300 mg total) by mouth 3 (three) times daily. Please call [phone] to schedule yearly appt or may request refills from PCP. 90 capsule 5   ibuprofen (ADVIL,MOTRIN) 200 MG tablet Take 200 mg by mouth every 6 (six) hours as needed for headache, mild pain or moderate pain.      meloxicam (MOBIC) 15 MG tablet Take 1 tablet (15 mg total) by mouth daily as needed for pain. 30 tablet 11   metFORMIN (GLUCOPHAGE) 1000 MG tablet Take 1,000 mg by mouth 2 (two) times daily with a meal.     ondansetron (ZOFRAN-ODT) 4 MG disintegrating tablet Take 1 tablet (4 mg total) by mouth every 8 (eight) hours as needed for nausea or vomiting. 20 tablet 0   sertraline (ZOLOFT) 100 MG tablet Take 100 mg by mouth daily.     traZODone (DESYREL) 50 MG tablet Take 50 mg by mouth at bedtime.      VENTOLIN HFA 108 (90 BASE) MCG/ACT inhaler Inhale 2 puffs into the lungs as needed for wheezing or shortness of breath.      No current facility-administered medications on file prior to visit.    ALLERGIES: No Known Allergies  FAMILY HISTORY: Family History   Problem Relation Age of Onset   Asthma Father        died at 41   Healthy Mother        died of "old age" at 83   Diabetes Brother    Diabetes Sister    Colon cancer Neg Hx     Objective:  Blood pressure 137/61, pulse 73, height '5\' 8"'$  (1.727 m), weight 143 lb 9.6 oz (65.1 kg). General: No acute distress.  Patient appears well-groomed.   Head:  Normocephalic/atraumatic Eyes:  fundi examined but not visualized Neck: supple, right sided paraspinal tenderness, full range of motion Heart: regular rate and rhythm Vascular: No carotid bruits. Neurological Exam: Mental status: alert and oriented to person, place, and time, speech fluent and not dysarthric, language intact. Cranial nerves: CN I: not tested CN II: pupils equal, round and reactive to light, visual fields intact CN III, IV, VI:  full range of motion, no nystagmus, no ptosis CN V: facial sensation intact. CN VII: upper and lower face symmetric CN VIII: hearing intact CN IX, X: gag intact, uvula midline CN XI: sternocleidomastoid and trapezius muscles intact CN XII: tongue midline Bulk &  Tone: normal, no fasciculations. Motor:  muscle strength 5/5 throughout Sensation:  Pinprick sensation intact.  Vibratory sensation reduced in right foot. Deep Tendon Reflexes:  2+ throughout,  toes downgoing.   Finger to nose testing:  Without dysmetria.   Heel to shin:  Without dysmetria.   Gait:  Normal station and stride.  Romberg negative.    Thank you for allowing me to take part in the care of this patient.  Metta Clines, DO  CC: Deland Pretty, MD

## 2022-05-14 ENCOUNTER — Ambulatory Visit: Payer: Medicare PPO | Admitting: Neurology

## 2022-05-14 ENCOUNTER — Encounter: Payer: Self-pay | Admitting: Neurology

## 2022-05-14 VITALS — BP 137/61 | HR 73 | Ht 68.0 in | Wt 143.6 lb

## 2022-05-14 DIAGNOSIS — H93A1 Pulsatile tinnitus, right ear: Secondary | ICD-10-CM

## 2022-05-14 DIAGNOSIS — H9313 Tinnitus, bilateral: Secondary | ICD-10-CM | POA: Diagnosis not present

## 2022-05-14 DIAGNOSIS — M47812 Spondylosis without myelopathy or radiculopathy, cervical region: Secondary | ICD-10-CM

## 2022-05-14 DIAGNOSIS — E1142 Type 2 diabetes mellitus with diabetic polyneuropathy: Secondary | ICD-10-CM | POA: Diagnosis not present

## 2022-05-14 DIAGNOSIS — M5416 Radiculopathy, lumbar region: Secondary | ICD-10-CM

## 2022-05-14 NOTE — Patient Instructions (Signed)
Check CTA head and neck Refer to pain management for neuropathic pain Further recommendation pending results.

## 2022-06-16 ENCOUNTER — Encounter: Payer: Self-pay | Admitting: Physical Medicine & Rehabilitation

## 2022-06-23 ENCOUNTER — Encounter: Payer: Self-pay | Admitting: Physical Medicine & Rehabilitation

## 2022-06-23 ENCOUNTER — Encounter: Payer: Medicare PPO | Attending: Physical Medicine & Rehabilitation | Admitting: Physical Medicine & Rehabilitation

## 2022-06-23 VITALS — BP 136/72 | HR 68 | Ht 68.0 in | Wt 142.2 lb

## 2022-06-23 DIAGNOSIS — E1142 Type 2 diabetes mellitus with diabetic polyneuropathy: Secondary | ICD-10-CM | POA: Diagnosis not present

## 2022-06-23 DIAGNOSIS — M5441 Lumbago with sciatica, right side: Secondary | ICD-10-CM | POA: Insufficient documentation

## 2022-06-23 DIAGNOSIS — G8929 Other chronic pain: Secondary | ICD-10-CM | POA: Diagnosis not present

## 2022-06-23 NOTE — Progress Notes (Unsigned)
Subjective:    Patient ID: Vincent Eaton, male    DOB: 1945-11-08, 77 y.o.   MRN: [id number]  HPI   Vincent Eaton is a 77 y.o. year old male  who  has a past medical history of 1st degree AV block, Allergy, Anxiety, Anxiety and depression, Asthma, BPH (benign prostatic hyperplasia), Colon polyps, Dysuria, GERD (gastroesophageal reflux disease), Hemangioma of liver, Hematuria, History of kidney stones, Peripheral neuropathy, Right ureteral stone, Type 2 diabetes mellitus ([place]), Ureterocele, Urgency of urination, and Wears glasses.   They are presenting to PM&R clinic as a new patient for pain management evaluation. They were referred for treatment of chronic pain.  He reports that he has had pain in his feet related to neuropathy since about 2012.  He says his doctor feels that it is related to B12 deficiency.  Pain is mostly on the bottom of his feet but also has pain on his dorsal feet as well.  He often feels like he is walking on sharp objects.  He says he feels like his feet are frozen and he is walking on it now.  He also developed a sciatica down the right lower extremity going down to his foot and around 2019.  The sciatica pain has improved overall somewhat since this time.  He went to about 2 sessions of physical therapy however since this time he has been doing [facility] therapies on his own at home.  Pain is often worsened by stress and mood.  He reports a lot of stress during prior custody battle.  His son and when his son was diagnosed with diabetes mellitus type 1.  She had a prior EMG/nerve conduction study completed that he reports that showed nerve damage in his feet.  He has occasional neck pain however this not a primary source of his discomfort. He was previously very active running about 4 miles a day but had to stop running due to the pain.    No red flags for back pain endorsed in Hx or ROS, saddle anesthesia, loss of bowel or bladder continence, new  weakness, new numbness/tingling, and pain waking up at nighttime.  Medications tried: Topical medications - Vasaline  Nsaids minimal Tylenol  minimal  Opiates -not tired  Lyrica - 122m TID , stopped it and it got worse Gabapentin- helped but stopped working  Cymbalta- didn't help much Nortriptyle- didn't help    Other treatments: PT/OT  -PT 4 years ago, does home exericses  Accupuncture/chiropractor/massage -N/A TENs unit - helps a lilttle Injections ESI - neck? Helped L arm shooting pain, ESI- lower back didn't help Surgery N/A   There are no diagnoses linked to this encounter.       Pain Inventory Average Pain 6 Pain Right Now 8 My pain is burning, dull, stabbing, and aching  In the last 24 hours, has pain interfered with the following? General activity 4 Relation with others 3 Enjoyment of life 7 What TIME of day is your pain at its worst? evening Sleep (in general) Fair  Pain is worse with: bending, inactivity, and standing Pain improves with: heat/ice, therapy/exercise, medication, and TENS Relief from Meds: 1  how many minutes can you walk? unlimited ability to climb steps?  yes do you drive?  yes  retired  numbness confusion loss of taste or smell suicidal thoughts  New pt  New pt    Family History  Problem Relation Age of Onset   Healthy Mother  died of "old age" at 85   Asthma Father        died at 44   Diabetes Sister    Diabetes Brother    Colon cancer Neg Hx    Social History   Socioeconomic History   Marital status: Married    Spouse name: Not on file   Number of children: 2   Years of education: PhD   Highest education level: Not on file  Occupational History   Occupation: retired professor  Tobacco Use   Smoking status: Former    Packs/day: 0.25    Years: 20.00    Total pack years: 5.00    Types: Cigarettes    Quit date: 1995    Years since quitting: 29.1   Smokeless tobacco: Never  Vaping Use   Vaping  Use: Never used  Substance and Sexual Activity   Alcohol use: No    Alcohol/week: 0.0 standard drinks of alcohol   Drug use: No   Sexual activity: Not on file  Other Topics Concern   Not on file  Social History Narrative   Lives with his two sons.   Right-handed.   1 cup per day.   Social Determinants of Health   Financial Resource Strain: Not on file  Food Insecurity: Not on file  Transportation Needs: Not on file  Physical Activity: Not on file  Stress: Not on file  Social Connections: Not on file   Past Surgical History:  Procedure Laterality Date   COLONOSCOPY     CYSTOSCOPY/RETROGRADE/URETEROSCOPY Right 03/10/2013   Procedure: CYSTOSCOPY UNROOF URETEROCELE STONE EXTRACTION;  Surgeon: Irine Seal, MD;  Location: [facility];  Service: Urology;  Laterality: Right;   EXTRACORPOREAL SHOCK WAVE LITHOTRIPSY Right 10-17-2010   Past Medical History:  Diagnosis Date   1st degree AV block    Allergy    seasonal   Anxiety    Anxiety and depression    Asthma    BPH (benign prostatic hyperplasia)    Colon polyps    adenomatous   Dysuria    GERD (gastroesophageal reflux disease)    WATCHES DIET   Hemangioma of liver    Hematuria    History of kidney stones    Peripheral neuropathy    Right ureteral stone    Type 2 diabetes mellitus ([facility])    Ureterocele    Urgency of urination    Wears glasses    BP 136/72   Pulse 68   Ht 5' 8"$  (1.727 m)   Wt 142 lb 3.2 oz (64.5 kg)   SpO2 97%   BMI 21.62 kg/m   Opioid Risk Score:   Fall Risk Score:  `1  Depression screen PHQ 2/9     06/23/2022    2:26 PM 09/05/2015    9:29 AM 09/05/2015    9:28 AM 01/16/2015   11:43 AM 01/16/2015   11:42 AM 09/18/2014    9:31 AM 09/04/2014   10:42 AM  Depression screen PHQ 2/9  Decreased Interest 0 0 0 0 0 0 0  Down, Depressed, Hopeless 0 0 0 0 0 0 0  PHQ - 2 Score 0 0 0 0 0 0 0  Altered sleeping 0        Tired, decreased energy 1        Change in appetite 0        Feeling  bad or failure about yourself  1        Trouble concentrating 1  Moving slowly or fidgety/restless 0        Suicidal thoughts 0        PHQ-9 Score 3        Difficult doing work/chores Somewhat difficult           Review of Systems  Musculoskeletal:  Positive for neck pain.  Neurological:  Positive for numbness.  All other systems reviewed and are negative.      Objective:   Physical Exam  Gen: no distress, normal appearing HEENT: oral mucosa pink and moist, NCAT Cardio: Reg rate Chest: normal effort, normal rate of breathing Abd: soft, non-distended Ext: no edema Psych: Very pleasant, normal affect Skin: intact Neuro:  Decreased sensation below ankles to LT Strength 5/5 in all 4 extremities Musculoskeletal:  Knee tenderness joint line R>L Vibration decreased at 1st mtp b/l appears to be intact medial malleolus Minimal tenderness Lumbar and cervical spine SLR neg b/l Corky Sox and Fadir negative Tender area R QL Spurlings negative  EMG from neurology note 01/27/2018 EMG nerve conduction study today showed mild peripheral neuropathy, with evidence of mild chronic right lumbosacral radiculopathy, mainly involving right L4-5 S1 myotomes.   MRI  L spine 2019  IMPRESSION: This MRI of the lumbar spine shows multilevel degenerative changes as detailed above.  The most significant findings are: 1.    At L2-L3, there are degenerative changes causing moderately severe left lateral recess stenosis with potential for left L3 nerve root compression. 2.    At L3-L4, there is borderline spinal stenosis but there does not appear to be any nerve root compression. 3.    At L4-L5, there is mild spinal stenosis and moderately severe right foraminal and lateral recess stenosis.  Edema is noted in the endplates more to the right.  There is potential for right L4 and right L5 nerve root compression. 4.    At L5-S1, there is moderately severe left foraminal narrowing that could lead to  compression of the left L5 nerve root. 5.    Degenerative changes have progressed when compared to the 2016 MRI.     MRI C spine 2016 IMPRESSION: 1. Cervical spondylosis and degenerative disc disease is progressive compared to 2009 and with bony findings similar to 09/05/2014, causing moderate to prominent impingement at C4-5 ; moderate impingement at C3-4 ; mild impingement at C2-3, C5-6, and C6-7, as detailed above.    Assessment & Plan:  Assessment and Plan: Vincent Eaton is a 77 y.o. year old male  who  has a past medical history of 1st degree AV block, Allergy, Anxiety, Anxiety and depression, Asthma, BPH (benign prostatic hyperplasia), Colon polyps, Dysuria, GERD (gastroesophageal reflux disease), Hemangioma of liver, Hematuria, History of kidney stones, Peripheral neuropathy, Right ureteral stone, Type 2 diabetes mellitus ([place]), Ureterocele, Urgency of urination, and Wears glasses.   They are presenting to PM&R clinic as a new patient for treatment of polyneuropathy pain.    Polyneuropathy likely related to dm2 Continue Lyrica 128m TID - dose escalation  limited by sedation -Spinal cord stimulator may be option at later time -Consider Tramadol if pain does not improved -Discussed Qutenza as an option for neuropathic pain control. Discussed that this is a capsaicin patch, stronger than capsaicin cream. Discussed that it is currently approved for diabetic peripheral neuropathy and post-herpetic neuralgia, but that it has also shown benefit in treating other forms of neuropathy. Provided patient with link to site to learn more about the patch: hCinemaBonus.fr Discussed that the patch would be placed  in office and benefits usually last 3 months. Discussed that unintended exposure to capsaicin can cause severe irritation of eyes, mucous membranes, respiratory tract, and skin, but that Qutenza is a local treatment and does not have the systemic side effects of other  nerve medications. Discussed that there may be pain, itching, erythema, and decreased sensory function associated with the application of Qutenza. Side effects usually subside within 1 week. A cold pack of analgesic medications can help with these side effects. Blood pressure can also be increased due to pain associated with administration of the patch.  Chronic lower back with right sided radiculopathy -PT consult placed  -Continue TENS -ORT- 1 low

## 2022-06-25 DIAGNOSIS — E119 Type 2 diabetes mellitus without complications: Secondary | ICD-10-CM | POA: Diagnosis not present

## 2022-06-25 DIAGNOSIS — H52203 Unspecified astigmatism, bilateral: Secondary | ICD-10-CM | POA: Diagnosis not present

## 2022-06-25 DIAGNOSIS — H31001 Unspecified chorioretinal scars, right eye: Secondary | ICD-10-CM | POA: Diagnosis not present

## 2022-06-25 DIAGNOSIS — H2513 Age-related nuclear cataract, bilateral: Secondary | ICD-10-CM | POA: Diagnosis not present

## 2022-06-27 NOTE — Therapy (Unsigned)
OUTPATIENT PHYSICAL THERAPY THORACOLUMBAR EVALUATION   Patient Name: Vincent Eaton MRN: [id number] DOB:1945-10-25, 77 y.o., male Today's Date: 06/30/2022  END OF SESSION:  PT End of Session - 06/30/22 1206     Visit Number 1    Number of Visits 8    Date for PT Re-Evaluation 08/18/22    Authorization Type [employer] MEDICARE CHOICE PPO    Progress Note Due on Visit 10    PT Start Time 1145    PT Stop Time 1230    PT Time Calculation (min) 45 min    Activity Tolerance Patient tolerated treatment well    Behavior During Therapy WFL for tasks assessed/performed             Past Medical History:  Diagnosis Date   1st degree AV block    Allergy    seasonal   Anxiety    Anxiety and depression    Asthma    BPH (benign prostatic hyperplasia)    Colon polyps    adenomatous   Dysuria    GERD (gastroesophageal reflux disease)    WATCHES DIET   Hemangioma of liver    Hematuria    History of kidney stones    Peripheral neuropathy    Right ureteral stone    Type 2 diabetes mellitus ([place])    Ureterocele    Urgency of urination    Wears glasses    Past Surgical History:  Procedure Laterality Date   COLONOSCOPY     CYSTOSCOPY/RETROGRADE/URETEROSCOPY Right 03/10/2013   Procedure: CYSTOSCOPY UNROOF URETEROCELE STONE EXTRACTION;  Surgeon: Irine Seal, MD;  Location: [place];  Service: Urology;  Laterality: Right;   EXTRACORPOREAL SHOCK WAVE LITHOTRIPSY Right 10-17-2010   Patient Active Problem List   Diagnosis Date Noted   Right lumbar radiculopathy 01/27/2018   Peripheral neuropathy 11/12/2017   Hypersomnia 09/16/2013   Abdominal pain 09/16/2013   Ureteral stone 03/10/2013   Ureterocele, congenital 03/10/2013    PCP: Deland Pretty, MD  REFERRING PROVIDER: Jennye Boroughs, MD  REFERRING DIAG: Chronic right-sided low back pain with right-sided sciatica [M54.41, G89.29]   Rationale for Evaluation and Treatment: Rehabilitation  THERAPY  DIAG:  Muscle weakness (generalized)  Other low back pain  Chronic right-sided low back pain with right-sided sciatica  ONSET DATE: 2019  SUBJECTIVE:                                                                                                                                                                                           SUBJECTIVE STATEMENT: Pt presents to PT with chronic lower back pain with sciatica and peripheral neuropathy. He  states that the neuropathy has been present for 10+ years, but the lower back pain and R side sciatica started in 2019. He drove from here to [place] when he noted numbness and tingling that persisted throughout the years. He reports improvements since the initial onset, but continues to have symptoms that are not relieved with medications or exercises.   PERTINENT HISTORY:  1st degree AV block, Anxiety, Depression, BPH, GERD, Peripheral neuropathy, DM II, urinary urgency.    PAIN:  Are you having pain? Yes: NPRS scale: 8/10 Pain location: R side lower back and into leg. Intermittent pain on L side.  Pain description: Numbness and tingling into R LE with palpation  Aggravating factors: sitting for increased periods of time, turning to R side to grab something.   Relieving factors: Good night sleep, gabapentin, walking.   PRECAUTIONS: None  WEIGHT BEARING RESTRICTIONS: No  FALLS:  Has patient fallen in last 6 months? No  LIVING ENVIRONMENT: Lives with: lives with their son Lives in: House/apartment Stairs: Yes: Internal: 12 steps; on right going up and External: 2 steps; bilateral but cannot reach both Has following equipment at home: None  OCCUPATION: Retired   PLOF: Independent  PATIENT GOALS: Pt would like to continue to be active without pain or limitations.   OBJECTIVE:   DIAGNOSTIC FINDINGS:  2019:  IMPRESSION: This MRI of the lumbar spine shows multilevel degenerative changes as detailed above.  The most significant findings  are: 1.    At L2-L3, there are degenerative changes causing moderately severe left lateral recess stenosis with potential for left L3 nerve root compression. 2.    At L3-L4, there is borderline spinal stenosis but there does not appear to be any nerve root compression. 3.    At L4-L5, there is mild spinal stenosis and moderately severe right foraminal and lateral recess stenosis.  Edema is noted in the endplates more to the right.  There is potential for right L4 and right L5 nerve root compression. 4.    At L5-S1, there is moderately severe left foraminal narrowing that could lead to compression of the left L5 nerve root. 5.    Degenerative changes have progressed when compared to the 2016 MRI.  PATIENT SURVEYS:  FOTO 60.3100%, 65% in 10 visits.   SCREENING FOR RED FLAGS: Bowel or bladder incontinence: Yes: urinary incontinence   COGNITION: Overall cognitive status: Within functional limits for tasks assessed     SENSATION: WFL  MUSCLE LENGTH: Hamstrings: Right 85% deg; Left 85%  deg   POSTURE: rounded shoulders and forward head  PALPATION: Tenderness to R multifidi and paraspinals.   LUMBAR ROM:   AROM eval  Flexion WFL  Extension WFL P! At end range   Right lateral flexion WFL moderate P! lower back and R LE  Left lateral flexion WFL mild pain R side lower back and into R LE  Right rotation [facility] P! lower back and R LE  Left rotation WFL mild P! on R side into lower back   (Blank rows = not tested)  LOWER EXTREMITY ROM:     Active  Right eval Left eval  Hip flexion [facility]  Knee flexion [facility] WFL  Knee extension WFL WFL   (Blank rows = not tested)  LOWER EXTREMITY MMT:    MMT Right eval Left eval  Hip flexion 5/5 5/5  Knee flexion 5/5 5/5  Knee extension 5/5 5/5  Core strength 4-/5    (Blank rows = not tested)  LUMBAR SPECIAL  TESTS:  Slump test: Negative  FUNCTIONAL TESTS:  5 times sit to stand: 9.15 seconds   GAIT: Distance walked: 33f  Assistive  device utilized: None Level of assistance: Complete Independence Comments: Decreased stride on R side.   TODAY'S TREATMENT:                                                                                                                              DATE: Creating, reviewing, and completing below HEP   PATIENT EDUCATION:  Education details: Educated pt on anatomy and physiology of current symptoms, FOTO, diagnosis, prognosis, HEP,  and POC. Person educated: Patient Education method: ECustomer service managerEducation comprehension: verbalized understanding and returned demonstration  HOME EXERCISE PROGRAM: Access Code: [email or web address]: [email or web address] Date: 06/30/2022 Prepared by: SRudi Heap Exercises - Supine Lower Trunk Rotation  - 2 x daily - 7 x weekly - 2 sets - 10 reps - 2 hold - Supine Piriformis Stretch with Foot on Ground  - 2 x daily - 7 x weekly - 2 sets - 2 reps - 30 hold   ASSESSMENT:  CLINICAL IMPRESSION: Patient referred to PT for chronic lower back pain with sciatica. He demonstrates full functional ROM with pain reported at end ranges. Pt has good functional strength in bilat LE's but would benefit from increased core strengthening. Pt states that his MD referred him here for deep tissue massages. Informed pt we do not provide deep tissue massages, but discussed dry needling and other alternative treatments available. Encouraged pt to seek out massage therapist for regular massages if that is what he is interested in at this time. Pt reports doing McKenzie related exercises at the gym. He has tenderness present at R multifidi and paraspinals resulting in symptoms radiating down his R side. Patient will benefit from skilled PT to address below impairments, limitations and improve overall function.  OBJECTIVE IMPAIRMENTS: decreased activity tolerance, difficulty walking, decreased balance, decreased endurance, decreased mobility, decreased ROM,  decreased strength, impaired flexibility, impaired LE use, postural dysfunction, and pain.  ACTIVITY LIMITATIONS: bending, lifting, carry, locomotion, cleaning, community activity, driving, and or occupation  PERSONAL FACTORS: 1st degree AV block, Anxiety, Depression, BPH, GERD, Peripheral neuropathy, DM II, urinary urgency are also affecting patient's functional outcome.  REHAB POTENTIAL: Good  CLINICAL DECISION MAKING: Stable/uncomplicated  EVALUATION COMPLEXITY: Low    GOALS: Short term PT Goals Target date: 07/14/2022 Pt will be I and compliant with HEP. Baseline:  Goal status: New Pt will decrease pain by 25% overall Baseline: Goal status: New  Long term PT goals Target date: 08/25/2022 Pt will improve lumbar [facility] to [facility] to improve functional mobility Baseline: Goal status: New Pt will improve core strength to at least 4+/5 MMT to improve functional strength Baseline: Goal status: New Pt will improve FOTO to at least 65% functional to show improved function Baseline: Goal status: New Pt will reduce pain by overall 50% overall with usual activity Baseline:  Goal status: New Pt will reduce pain to overall less than 2-3/10 with transfers from sitting to standing.  Baseline: Goal status: New Pt will be able to sleep 8 hours without onset of symptoms.  Baseline: Goal status: New  PLAN: PT FREQUENCY: 1 x per week   PT DURATION: 6-8 weeks  PLANNED INTERVENTIONS (unless contraindicated): aquatic PT, Canalith repositioning, cryotherapy, Electrical stimulation, Iontophoresis with 4 mg/ml dexamethasome, Moist heat, traction, Ultrasound, gait training, Therapeutic exercise, balance training, neuromuscular re-education, patient/family education, prosthetic training, manual techniques, passive ROM, dry needling, taping, vasopnuematic device, vestibular, spinal manipulations, joint manipulations  PLAN FOR NEXT SESSION: Assess HEP/update PRN, DN next session.  Core strengthening. Address sleeping positions.    Lynden Ang, PT 06/30/2022, 12:24 PM

## 2022-06-30 ENCOUNTER — Ambulatory Visit: Payer: Medicare PPO | Attending: Physical Medicine & Rehabilitation | Admitting: Physical Therapy

## 2022-06-30 ENCOUNTER — Other Ambulatory Visit: Payer: Self-pay

## 2022-06-30 ENCOUNTER — Encounter: Payer: Self-pay | Admitting: Physical Therapy

## 2022-06-30 DIAGNOSIS — G8929 Other chronic pain: Secondary | ICD-10-CM | POA: Insufficient documentation

## 2022-06-30 DIAGNOSIS — M5441 Lumbago with sciatica, right side: Secondary | ICD-10-CM | POA: Insufficient documentation

## 2022-06-30 DIAGNOSIS — M6281 Muscle weakness (generalized): Secondary | ICD-10-CM | POA: Insufficient documentation

## 2022-06-30 DIAGNOSIS — M5459 Other low back pain: Secondary | ICD-10-CM | POA: Insufficient documentation

## 2022-07-07 ENCOUNTER — Encounter: Payer: Medicare PPO | Admitting: Physical Therapy

## 2022-07-07 ENCOUNTER — Encounter: Payer: Self-pay | Admitting: Neurology

## 2022-07-09 ENCOUNTER — Ambulatory Visit
Admission: RE | Admit: 2022-07-09 | Discharge: 2022-07-09 | Disposition: A | Discharge status: Home / Self Care | Payer: Medicare PPO | Source: Ambulatory Visit | Attending: Neurology | Admitting: Neurology

## 2022-07-09 ENCOUNTER — Other Ambulatory Visit: Payer: Medicare PPO

## 2022-07-09 DIAGNOSIS — I6523 Occlusion and stenosis of bilateral carotid arteries: Secondary | ICD-10-CM | POA: Diagnosis not present

## 2022-07-09 DIAGNOSIS — H9313 Tinnitus, bilateral: Secondary | ICD-10-CM

## 2022-07-09 DIAGNOSIS — H93A1 Pulsatile tinnitus, right ear: Secondary | ICD-10-CM

## 2022-07-09 MED ORDER — IOPAMIDOL (ISOVUE-370) INJECTION 76%
75.0000 mL | Freq: Once | INTRAVENOUS | Status: AC | PRN
  Administered 2022-07-09: 75 mL via INTRAVENOUS

## 2022-07-11 NOTE — Progress Notes (Signed)
LMOVm for patient to call the office back.  

## 2022-07-14 NOTE — Progress Notes (Signed)
Patient advise of his CTA results.

## 2022-07-16 ENCOUNTER — Ambulatory Visit: Payer: Medicare PPO | Admitting: Rehabilitative and Restorative Service Providers"

## 2022-07-22 ENCOUNTER — Encounter: Payer: Medicare PPO | Admitting: Rehabilitative and Restorative Service Providers"

## 2022-07-29 ENCOUNTER — Encounter: Payer: Medicare PPO | Admitting: Rehabilitative and Restorative Service Providers"

## 2022-08-05 ENCOUNTER — Encounter: Payer: Medicare PPO | Admitting: Physical Medicine & Rehabilitation

## 2022-08-13 ENCOUNTER — Telehealth: Payer: Self-pay | Admitting: Physical Medicine & Rehabilitation

## 2022-08-13 NOTE — Telephone Encounter (Signed)
Patient called in and states the qutenza patches would be to expensive for him . Patient received est letter and asked if there is an alternative procedure for him ? I informed patient that we could change appointment to a follow up but he would like to know if there are other options before he comes in for a follow up .

## 2022-08-19 NOTE — Telephone Encounter (Signed)
Patient called back and states he has taken oral pain medication in the past and did not help.

## 2022-08-19 NOTE — Telephone Encounter (Signed)
Left message per Dr. Sande Rives response.

## 2022-08-21 ENCOUNTER — Encounter: Payer: Medicare PPO | Admitting: Physical Medicine & Rehabilitation

## 2022-10-09 DIAGNOSIS — E0840 Diabetes mellitus due to underlying condition with diabetic neuropathy, unspecified: Secondary | ICD-10-CM | POA: Diagnosis not present

## 2022-10-09 DIAGNOSIS — Z125 Encounter for screening for malignant neoplasm of prostate: Secondary | ICD-10-CM | POA: Diagnosis not present

## 2022-10-14 DIAGNOSIS — Z0000 Encounter for general adult medical examination without abnormal findings: Secondary | ICD-10-CM | POA: Diagnosis not present

## 2022-10-14 DIAGNOSIS — N1831 Chronic kidney disease, stage 3a: Secondary | ICD-10-CM | POA: Diagnosis not present

## 2022-10-14 DIAGNOSIS — E1142 Type 2 diabetes mellitus with diabetic polyneuropathy: Secondary | ICD-10-CM | POA: Diagnosis not present

## 2022-10-14 DIAGNOSIS — N401 Enlarged prostate with lower urinary tract symptoms: Secondary | ICD-10-CM | POA: Diagnosis not present

## 2022-10-14 DIAGNOSIS — I440 Atrioventricular block, first degree: Secondary | ICD-10-CM | POA: Diagnosis not present

## 2022-10-14 DIAGNOSIS — I700 Atherosclerosis of aorta: Secondary | ICD-10-CM | POA: Diagnosis not present

## 2022-10-14 DIAGNOSIS — K219 Gastro-esophageal reflux disease without esophagitis: Secondary | ICD-10-CM | POA: Diagnosis not present

## 2022-10-14 DIAGNOSIS — E0840 Diabetes mellitus due to underlying condition with diabetic neuropathy, unspecified: Secondary | ICD-10-CM | POA: Diagnosis not present

## 2022-10-14 DIAGNOSIS — D649 Anemia, unspecified: Secondary | ICD-10-CM | POA: Diagnosis not present

## 2022-10-22 ENCOUNTER — Encounter: Payer: Self-pay | Admitting: Internal Medicine

## 2022-10-22 DIAGNOSIS — E1139 Type 2 diabetes mellitus with other diabetic ophthalmic complication: Secondary | ICD-10-CM | POA: Diagnosis not present

## 2022-10-22 DIAGNOSIS — E875 Hyperkalemia: Secondary | ICD-10-CM | POA: Diagnosis not present

## 2022-11-04 DIAGNOSIS — E0840 Diabetes mellitus due to underlying condition with diabetic neuropathy, unspecified: Secondary | ICD-10-CM | POA: Diagnosis not present

## 2022-11-04 DIAGNOSIS — E611 Iron deficiency: Secondary | ICD-10-CM | POA: Diagnosis not present

## 2022-11-04 DIAGNOSIS — E875 Hyperkalemia: Secondary | ICD-10-CM | POA: Diagnosis not present

## 2022-11-05 DIAGNOSIS — E611 Iron deficiency: Secondary | ICD-10-CM | POA: Diagnosis not present

## 2022-11-10 DIAGNOSIS — M25652 Stiffness of left hip, not elsewhere classified: Secondary | ICD-10-CM | POA: Diagnosis not present

## 2022-11-10 DIAGNOSIS — M25551 Pain in right hip: Secondary | ICD-10-CM | POA: Diagnosis not present

## 2022-11-10 DIAGNOSIS — R262 Difficulty in walking, not elsewhere classified: Secondary | ICD-10-CM | POA: Diagnosis not present

## 2022-11-10 DIAGNOSIS — M25651 Stiffness of right hip, not elsewhere classified: Secondary | ICD-10-CM | POA: Diagnosis not present

## 2022-11-10 DIAGNOSIS — M532X6 Spinal instabilities, lumbar region: Secondary | ICD-10-CM | POA: Diagnosis not present

## 2022-11-10 DIAGNOSIS — M5417 Radiculopathy, lumbosacral region: Secondary | ICD-10-CM | POA: Diagnosis not present

## 2022-11-10 DIAGNOSIS — R293 Abnormal posture: Secondary | ICD-10-CM | POA: Diagnosis not present

## 2022-12-01 DIAGNOSIS — M25652 Stiffness of left hip, not elsewhere classified: Secondary | ICD-10-CM | POA: Diagnosis not present

## 2022-12-01 DIAGNOSIS — M532X6 Spinal instabilities, lumbar region: Secondary | ICD-10-CM | POA: Diagnosis not present

## 2022-12-01 DIAGNOSIS — M25551 Pain in right hip: Secondary | ICD-10-CM | POA: Diagnosis not present

## 2022-12-01 DIAGNOSIS — M25651 Stiffness of right hip, not elsewhere classified: Secondary | ICD-10-CM | POA: Diagnosis not present

## 2022-12-01 DIAGNOSIS — R262 Difficulty in walking, not elsewhere classified: Secondary | ICD-10-CM | POA: Diagnosis not present

## 2022-12-01 DIAGNOSIS — M5417 Radiculopathy, lumbosacral region: Secondary | ICD-10-CM | POA: Diagnosis not present

## 2022-12-01 DIAGNOSIS — R293 Abnormal posture: Secondary | ICD-10-CM | POA: Diagnosis not present

## 2022-12-02 DIAGNOSIS — D72819 Decreased white blood cell count, unspecified: Secondary | ICD-10-CM | POA: Diagnosis not present

## 2022-12-04 DIAGNOSIS — R293 Abnormal posture: Secondary | ICD-10-CM | POA: Diagnosis not present

## 2022-12-04 DIAGNOSIS — M25551 Pain in right hip: Secondary | ICD-10-CM | POA: Diagnosis not present

## 2022-12-04 DIAGNOSIS — M25651 Stiffness of right hip, not elsewhere classified: Secondary | ICD-10-CM | POA: Diagnosis not present

## 2022-12-04 DIAGNOSIS — M25652 Stiffness of left hip, not elsewhere classified: Secondary | ICD-10-CM | POA: Diagnosis not present

## 2022-12-04 DIAGNOSIS — M5417 Radiculopathy, lumbosacral region: Secondary | ICD-10-CM | POA: Diagnosis not present

## 2022-12-04 DIAGNOSIS — M532X6 Spinal instabilities, lumbar region: Secondary | ICD-10-CM | POA: Diagnosis not present

## 2022-12-04 DIAGNOSIS — R262 Difficulty in walking, not elsewhere classified: Secondary | ICD-10-CM | POA: Diagnosis not present

## 2022-12-08 DIAGNOSIS — M5417 Radiculopathy, lumbosacral region: Secondary | ICD-10-CM | POA: Diagnosis not present

## 2022-12-08 DIAGNOSIS — R262 Difficulty in walking, not elsewhere classified: Secondary | ICD-10-CM | POA: Diagnosis not present

## 2022-12-08 DIAGNOSIS — M532X6 Spinal instabilities, lumbar region: Secondary | ICD-10-CM | POA: Diagnosis not present

## 2022-12-08 DIAGNOSIS — M25651 Stiffness of right hip, not elsewhere classified: Secondary | ICD-10-CM | POA: Diagnosis not present

## 2022-12-08 DIAGNOSIS — M25551 Pain in right hip: Secondary | ICD-10-CM | POA: Diagnosis not present

## 2022-12-08 DIAGNOSIS — M25652 Stiffness of left hip, not elsewhere classified: Secondary | ICD-10-CM | POA: Diagnosis not present

## 2022-12-08 DIAGNOSIS — R293 Abnormal posture: Secondary | ICD-10-CM | POA: Diagnosis not present

## 2022-12-17 DIAGNOSIS — E039 Hypothyroidism, unspecified: Secondary | ICD-10-CM | POA: Diagnosis not present

## 2022-12-17 DIAGNOSIS — D72819 Decreased white blood cell count, unspecified: Secondary | ICD-10-CM | POA: Diagnosis not present

## 2022-12-18 DIAGNOSIS — E611 Iron deficiency: Secondary | ICD-10-CM | POA: Diagnosis not present

## 2022-12-18 DIAGNOSIS — E039 Hypothyroidism, unspecified: Secondary | ICD-10-CM | POA: Diagnosis not present

## 2022-12-18 DIAGNOSIS — E875 Hyperkalemia: Secondary | ICD-10-CM | POA: Diagnosis not present

## 2022-12-24 ENCOUNTER — Encounter: Payer: Self-pay | Admitting: Pulmonary Disease

## 2022-12-24 ENCOUNTER — Ambulatory Visit: Payer: Medicare PPO | Admitting: Pulmonary Disease

## 2022-12-24 ENCOUNTER — Ambulatory Visit: Payer: Medicare PPO

## 2022-12-24 VITALS — BP 128/70 | HR 72 | Temp 97.8°F | Ht 68.0 in | Wt 149.4 lb

## 2022-12-24 DIAGNOSIS — R0989 Other specified symptoms and signs involving the circulatory and respiratory systems: Secondary | ICD-10-CM

## 2022-12-24 DIAGNOSIS — R918 Other nonspecific abnormal finding of lung field: Secondary | ICD-10-CM | POA: Diagnosis not present

## 2022-12-24 NOTE — Progress Notes (Signed)
Vincent Eaton    [id number]    08/29/1945  Primary Care Physician:Pharr, Zollie Beckers, MD  Referring Physician: Merri Brunette, MD [address] [place],  [place] [address]  Chief complaint: Evaluation for hyperinflation  HPI: 77 y.o. who  has a past medical history of 1st degree AV block, Allergy, Anxiety, Anxiety and depression, Asthma, BPH (benign prostatic hyperplasia), Colon polyps, Dysuria, GERD (gastroesophageal reflux disease), Hemangioma of liver, Hematuria, History of kidney stones, Peripheral neuropathy, Right ureteral stone, Type 2 diabetes mellitus (HCC), Ureterocele, Urgency of urination, and Wears glasses.   He has history of mild asthma and is using albuterol as needed.  He is not on any controller medication.  He recently had a CT head and neck which was read as possible hyperinflation of the lungs in the apices and has been referred here for further evaluation.  He has occasional cough, white clear mucus and chest congestion.  Pets: No pets Occupation: Retired Designer, television/film set Exposures: No mold, hot tub, Financial controller.  No feather pillows or comforters Smoking history: 5-pack-year smoker.  Quit in 1997 Travel history: Originally from [place].  No significant recent travel Relevant family history: No family history of lung disease  Outpatient Encounter Medications as of 12/24/2022  Medication Sig   B Complex-C-Folic Acid (B COMPLEX-VITAMIN C-FOLIC ACID) 1 MG tablet Take 1 tablet by mouth daily with breakfast.   fluticasone (FLONASE) 50 MCG/ACT nasal spray Place 1 spray into both nostrils as needed.    ibuprofen (ADVIL,MOTRIN) 200 MG tablet Take 200 mg by mouth every 6 (six) hours as needed for headache, mild pain or moderate pain.    levothyroxine (SYNTHROID) 25 MCG tablet Take 25 mcg by mouth daily.   pantoprazole (PROTONIX) 40 MG tablet Take 40 mg by mouth daily.   pregabalin (LYRICA) 100 MG capsule Take 100 mg by mouth 3  (three) times daily.   sertraline (ZOLOFT) 100 MG tablet Take 100 mg by mouth daily.   SYNJARDY XR 09-998 MG TB24 Take 1,000 mg by mouth 2 (two) times daily.   traZODone (DESYREL) 50 MG tablet Take 50 mg by mouth at bedtime.    VENTOLIN HFA 108 (90 BASE) MCG/ACT inhaler Inhale 2 puffs into the lungs as needed for wheezing or shortness of breath.    No facility-administered encounter medications on file as of 12/24/2022.    Allergies as of 12/24/2022   (No Known Allergies)    Past Medical History:  Diagnosis Date   1st degree AV block    Allergy    seasonal   Anxiety    Anxiety and depression    Asthma    BPH (benign prostatic hyperplasia)    Colon polyps    adenomatous   Dysuria    GERD (gastroesophageal reflux disease)    WATCHES DIET   Hemangioma of liver    Hematuria    History of kidney stones    Peripheral neuropathy    Right ureteral stone    Type 2 diabetes mellitus (HCC)    Ureterocele    Urgency of urination    Wears glasses     Past Surgical History:  Procedure Laterality Date   COLONOSCOPY     CYSTOSCOPY/RETROGRADE/URETEROSCOPY Right 03/10/2013   Procedure: CYSTOSCOPY UNROOF URETEROCELE STONE EXTRACTION;  Surgeon: Bjorn Pippin, MD;  Location: [facility];  Service: Urology;  Laterality: Right;   EXTRACORPOREAL SHOCK WAVE LITHOTRIPSY Right 10-17-2010    Family History  Problem Relation  Age of Onset   Healthy Mother        died of "old age" at 91   Asthma Father        died at 73   Diabetes Sister    Diabetes Brother    Colon cancer Neg Hx     Social History   Socioeconomic History   Marital status: Married    Spouse name: Not on file   Number of children: 2   Years of education: PhD   Highest education level: Not on file  Occupational History   Occupation: retired professor  Tobacco Use   Smoking status: Former    Current packs/day: 0.00    Average packs/day: 0.3 packs/day for 20.0 years (5.0 ttl pk-yrs)    Types: Cigarettes     Start date: 76    Quit date: 1995    Years since quitting: 29.6   Smokeless tobacco: Never  Vaping Use   Vaping status: Never Used  Substance and Sexual Activity   Alcohol use: No    Alcohol/week: 0.0 standard drinks of alcohol   Drug use: No   Sexual activity: Not on file  Other Topics Concern   Not on file  Social History Narrative   Lives with his two sons.   Right-handed.   1 cup per day.   Social Determinants of Health   Financial Resource Strain: Not on file  Food Insecurity: Not on file  Transportation Needs: Not on file  Physical Activity: Not on file  Stress: Not on file  Social Connections: Not on file  Intimate Partner Violence: Not on file    Review of systems: Review of Systems  Constitutional: Negative for fever and chills.  HENT: Negative.   Eyes: Negative for blurred vision.  Respiratory: as per HPI  Cardiovascular: Negative for chest pain and palpitations.  Gastrointestinal: Negative for vomiting, diarrhea, blood per rectum. Genitourinary: Negative for dysuria, urgency, frequency and hematuria.  Musculoskeletal: Negative for myalgias, back pain and joint pain.  Skin: Negative for itching and rash.  Neurological: Negative for dizziness, tremors, focal weakness, seizures and loss of consciousness.  Endo/Heme/Allergies: Negative for environmental allergies.  Psychiatric/Behavioral: Negative for depression, suicidal ideas and hallucinations.  All other systems reviewed and are negative.  Physical Exam: Blood pressure 128/70, pulse 72, temperature 97.8 F (36.6 C), temperature source Oral, height 5\' 8"  (1.727 m), weight 149 lb 6.4 oz (67.8 kg), SpO2 98%. Gen:      No acute distress HEENT:  EOMI, sclera anicteric Neck:     No masses; no thyromegaly Lungs:    Clear to auscultation bilaterally; normal respiratory effort CV:         Regular rate and rhythm; no murmurs Abd:      + bowel sounds; soft, non-tender; no palpable masses, no  distension Ext:    No edema; adequate peripheral perfusion Skin:      Warm and dry; no rash Neuro: alert and oriented x 3 Psych: normal mood and affect  Data Reviewed: Imaging: CT abdomen pelvis 05/05/2021-visualized lungs are clear. CT angio head and neck 07/10/2022-upper chest images showed large lung volumes, suspicious for pulmonary hyperinflation. I had reviewed the images personally  PFTs:  Labs:  Assessment:  Pulmonary hyperinflation This is read on CT head and neck on review of episodes of the lungs.  Low suspicion for COPD as he has minimal smoking history and his asthma is very mild and does not need controller medication Will assess with chest x-ray.  Order  PFTs  Plan/Recommendations: Chest x-ray today PFTs and return to clinic in 6 months   Chilton Greathouse MD [facility] 12/24/2022, 10:45 AM  CC: Merri Brunette, MD

## 2022-12-24 NOTE — Patient Instructions (Addendum)
Will get a chest x-ray today for evaluation of hyperinflation Schedule PFTs and follow-up in 6 months. Return to clinic in 6 months after PFTs

## 2023-01-27 ENCOUNTER — Ambulatory Visit: Payer: Medicare PPO | Admitting: Internal Medicine

## 2023-01-27 ENCOUNTER — Encounter: Payer: Self-pay | Admitting: Internal Medicine

## 2023-01-27 VITALS — BP 130/62 | HR 64 | Ht 68.0 in | Wt 148.0 lb

## 2023-01-27 DIAGNOSIS — K219 Gastro-esophageal reflux disease without esophagitis: Secondary | ICD-10-CM

## 2023-01-27 DIAGNOSIS — Z86010 Personal history of colonic polyps: Secondary | ICD-10-CM | POA: Diagnosis not present

## 2023-01-27 DIAGNOSIS — D509 Iron deficiency anemia, unspecified: Secondary | ICD-10-CM

## 2023-01-27 MED ORDER — PLENVU 140 G PO SOLR: 1.0000 | Freq: Once | ORAL | 0 refills | Status: AC

## 2023-01-27 NOTE — Patient Instructions (Signed)
You have been scheduled for an endoscopy and colonoscopy. Please follow the written instructions given to you at your visit today.  Please pick up your prep supplies at the pharmacy within the next 1-3 days.  If you use inhalers (even only as needed), please bring them with you on the day of your procedure.  DO NOT TAKE 7 DAYS PRIOR TO TEST- Trulicity (dulaglutide) Ozempic, Wegovy (semaglutide) Mounjaro (tirzepatide) Bydureon Bcise (exanatide extended release)  DO NOT TAKE 1 DAY PRIOR TO YOUR TEST Rybelsus (semaglutide) Adlyxin (lixisenatide) Victoza (liraglutide) Byetta (exanatide) _______________________________________________________________  _______________________________________________________  If your blood pressure at your visit was 140/90 or greater, please contact your primary care physician to follow up on this.  _______________________________________________________  If you are age 85 or older, your body mass index should be between 23-30. Your Body mass index is 22.5 kg/m. If this is out of the aforementioned range listed, please consider follow up with your Primary Care Provider.  If you are age 51 or younger, your body mass index should be between 19-25. Your Body mass index is 22.5 kg/m. If this is out of the aformentioned range listed, please consider follow up with your Primary Care Provider.   ________________________________________________________  The [facility] providers would like to encourage you to use [facility] to communicate with providers for non-urgent requests or questions.  Due to long hold times on the telephone, sending your provider a message by [facility] may be a faster and more efficient way to get a response.  Please allow 48 business hours for a response.  Please remember that this is for non-urgent requests.  _______________________________________________________

## 2023-01-27 NOTE — Progress Notes (Signed)
HISTORY OF PRESENT ILLNESS:  Vincent Eaton is a 77 y.o. male with multiple problems as listed below who is sent today by his primary care provider regarding mild iron deficiency anemia and the need for surveillance colonoscopy.  The patient was evaluated in this office August 15, 2014 regarding abdominal discomfort, abnormal laboratories, GERD, hemorrhoids, and a history of adenomatous colon polyps for which surveillance was due.  He subsequently underwent colonoscopy and upper endoscopy August 22, 2015.  Colonoscopy revealed none adenomatous colon polyps.  Due to his personal history, follow-up in 5 years recommended.  Upper endoscopy was normal.  Patient does have a history of GERD for which she is on pantoprazole.  Does take Advil regularly.  He is a type II diabetic on oral agents.  In terms of GI complaints, he does have a sensation that is difficult for him to evacuate his bowel.  He would not describe it as constipation.  Occasionally he has loose stools.  He states that he feels like he has "a blockage".  In June he was noted to have mild iron deficiency anemia with a hemoglobin of 11.2, MCV 81, and iron saturation 3%.  He was placed on daily iron supplement, which she continues to take.  Repeat blood work last week had normalized.  He denies melena or hematochezia.  He did undergo a contrast-enhanced CT scan of the abdomen and pelvis January 2023 to evaluate nausea, vomiting, and mid abdominal pain.  No acute abnormalities noted.  REVIEW OF SYSTEMS:  All non-GI ROS negative unless otherwise stated in the HPI except for seasonal allergies, anxiety.  Past Medical History:  Diagnosis Date   1st degree AV block    Allergy    seasonal   Anxiety    Anxiety and depression    Asthma    BPH (benign prostatic hyperplasia)    Colon polyps    adenomatous   Diabetic retinopathy (HCC)    Dysuria    GERD (gastroesophageal reflux disease)    WATCHES DIET   Hemangioma of liver     Hematuria    History of kidney stones    OSA (obstructive sleep apnea)    Peripheral neuropathy    Right ureteral stone    Trigger finger    Type 2 diabetes mellitus (HCC)    Ureterocele    Urgency of urination    Wears glasses     Past Surgical History:  Procedure Laterality Date   COLONOSCOPY     CYSTOSCOPY/RETROGRADE/URETEROSCOPY Right 03/10/2013   Procedure: CYSTOSCOPY UNROOF URETEROCELE STONE EXTRACTION;  Surgeon: Bjorn Pippin, MD;  Location: [facility];  Service: Urology;  Laterality: Right;   EXTRACORPOREAL SHOCK WAVE LITHOTRIPSY Right 10-17-2010    Social History Vincent Eaton  reports that he quit smoking about 29 years ago. His smoking use included cigarettes. He started smoking about 49 years ago. He has a 5 pack-year smoking history. He has never used smokeless tobacco. He reports that he does not drink alcohol and does not use drugs.  family history includes Asthma in his father; Diabetes in his brother and sister; Healthy in his mother.  No Known Allergies     PHYSICAL EXAMINATION: Vital signs: BP 130/62   Pulse 64   Ht 5\' 8"  (1.727 m)   Wt 148 lb (67.1 kg)   BMI 22.50 kg/m   Constitutional: generally well-appearing, no acute distress Psychiatric: alert and oriented x3, cooperative Eyes: extraocular movements intact, anicteric, conjunctiva pink Mouth: oral pharynx moist, no  lesions Neck: supple no lymphadenopathy Cardiovascular: heart regular rate and rhythm, no murmur Lungs: clear to auscultation bilaterally Abdomen: soft, nontender, nondistended, no obvious ascites, no peritoneal signs, normal bowel sounds, no organomegaly Rectal: Deferred until colonoscopy Extremities: no clubbing, cyanosis, or lower extremity edema bilaterally Skin: no lesions on visible extremities Neuro: No focal deficits.  Cranial nerves intact  ASSESSMENT:  1.  Mild iron deficiency anemia.  Responded to oral iron.  Rule out GI mucosal lesion. 2.   Personal history of adenomatous colon polyps.  Due for surveillance 3.  Chronic GERD.  On pantoprazole 40 mg daily 4.  Chronic intermittent NSAID use 5.  General Medical problems including diabetes mellitus   PLAN:  1.  Schedule colonoscopy to evaluate iron deficiency anemia and provide neoplasia surveillance.The nature of the procedure, as well as the risks, benefits, and alternatives were carefully and thoroughly reviewed with the patient. Ample time for discussion and questions allowed. The patient understood, was satisfied, and agreed to proceed. 2.  Schedule upper endoscopy to evaluate iron deficiency anemia.The nature of the procedure, as well as the risks, benefits, and alternatives were carefully and thoroughly reviewed with the patient. Ample time for discussion and questions allowed. The patient understood, was satisfied, and agreed to proceed. 3.  Continue pantoprazole 40 mg daily 4.  Hold diabetic medications the day of the procedure to avoid unwanted hypoglycemia 5.  Hold iron supplement 1 week prior to the procedure to assist with bowel preparation 6.  Further recommendations to be determined after the above completed. A total time of 60 minutes was spent preparing to see the patient, reviewing the myriad of outside records, obtaining comprehensive history, performing medically appropriate physical examination, ordering multiple endoscopic procedures, adjusting his medications for his procedures, counseling and educating the patient regarding the above listed issues, and documenting clinical information in the health record

## 2023-02-02 ENCOUNTER — Telehealth: Payer: Self-pay | Admitting: Internal Medicine

## 2023-02-02 NOTE — Telephone Encounter (Signed)
Inbound call from patient stating prep medication for 10/31 colonoscopy has been denied by insurance. Patient requesting an alternative medication. Please advise, thank you.

## 2023-02-04 DIAGNOSIS — R262 Difficulty in walking, not elsewhere classified: Secondary | ICD-10-CM | POA: Diagnosis not present

## 2023-02-04 DIAGNOSIS — M25674 Stiffness of right foot, not elsewhere classified: Secondary | ICD-10-CM | POA: Diagnosis not present

## 2023-02-04 DIAGNOSIS — M25571 Pain in right ankle and joints of right foot: Secondary | ICD-10-CM | POA: Diagnosis not present

## 2023-02-04 NOTE — Telephone Encounter (Signed)
Lm on vm that I would leave a Plenvu sample up front to be picked up

## 2023-02-09 DIAGNOSIS — R262 Difficulty in walking, not elsewhere classified: Secondary | ICD-10-CM | POA: Diagnosis not present

## 2023-02-09 DIAGNOSIS — M5417 Radiculopathy, lumbosacral region: Secondary | ICD-10-CM | POA: Diagnosis not present

## 2023-02-09 DIAGNOSIS — M25652 Stiffness of left hip, not elsewhere classified: Secondary | ICD-10-CM | POA: Diagnosis not present

## 2023-02-09 DIAGNOSIS — M25551 Pain in right hip: Secondary | ICD-10-CM | POA: Diagnosis not present

## 2023-02-09 DIAGNOSIS — M532X6 Spinal instabilities, lumbar region: Secondary | ICD-10-CM | POA: Diagnosis not present

## 2023-02-09 DIAGNOSIS — M25651 Stiffness of right hip, not elsewhere classified: Secondary | ICD-10-CM | POA: Diagnosis not present

## 2023-02-09 DIAGNOSIS — R293 Abnormal posture: Secondary | ICD-10-CM | POA: Diagnosis not present

## 2023-02-18 DIAGNOSIS — E039 Hypothyroidism, unspecified: Secondary | ICD-10-CM | POA: Diagnosis not present

## 2023-02-18 DIAGNOSIS — E1139 Type 2 diabetes mellitus with other diabetic ophthalmic complication: Secondary | ICD-10-CM | POA: Diagnosis not present

## 2023-02-18 DIAGNOSIS — D649 Anemia, unspecified: Secondary | ICD-10-CM | POA: Diagnosis not present

## 2023-02-19 ENCOUNTER — Encounter: Payer: Self-pay | Admitting: Internal Medicine

## 2023-02-19 DIAGNOSIS — E039 Hypothyroidism, unspecified: Secondary | ICD-10-CM | POA: Diagnosis not present

## 2023-02-19 DIAGNOSIS — E119 Type 2 diabetes mellitus without complications: Secondary | ICD-10-CM | POA: Diagnosis not present

## 2023-03-05 ENCOUNTER — Ambulatory Visit: Payer: Medicare PPO | Admitting: Internal Medicine

## 2023-03-05 ENCOUNTER — Encounter: Payer: Self-pay | Admitting: Internal Medicine

## 2023-03-05 VITALS — BP 120/63 | HR 62 | Temp 97.1°F | Resp 10 | Ht 68.0 in | Wt 148.0 lb

## 2023-03-05 DIAGNOSIS — Z860101 Personal history of adenomatous and serrated colon polyps: Secondary | ICD-10-CM

## 2023-03-05 DIAGNOSIS — F418 Other specified anxiety disorders: Secondary | ICD-10-CM | POA: Diagnosis not present

## 2023-03-05 DIAGNOSIS — Z09 Encounter for follow-up examination after completed treatment for conditions other than malignant neoplasm: Secondary | ICD-10-CM

## 2023-03-05 DIAGNOSIS — Z860100 Personal history of colon polyps, unspecified: Secondary | ICD-10-CM

## 2023-03-05 DIAGNOSIS — K219 Gastro-esophageal reflux disease without esophagitis: Secondary | ICD-10-CM

## 2023-03-05 DIAGNOSIS — D509 Iron deficiency anemia, unspecified: Secondary | ICD-10-CM

## 2023-03-05 DIAGNOSIS — K319 Disease of stomach and duodenum, unspecified: Secondary | ICD-10-CM | POA: Diagnosis not present

## 2023-03-05 DIAGNOSIS — G4733 Obstructive sleep apnea (adult) (pediatric): Secondary | ICD-10-CM | POA: Diagnosis not present

## 2023-03-05 MED ORDER — SODIUM CHLORIDE 0.9 % IV SOLN: 500.0000 mL | Freq: Once | INTRAVENOUS | Status: DC

## 2023-03-05 NOTE — Progress Notes (Signed)
Pt's states no medical or surgical changes since previsit or office visit. 

## 2023-03-05 NOTE — Patient Instructions (Signed)
-   Resume previous diet. - Continue present medications. - Await pathology results. - Take entirely an iron supplement once daily - Return to the care of your primary provider  YOU HAD AN ENDOSCOPIC PROCEDURE TODAY AT THE [facility]:   Refer to the procedure report that was given to you for any specific questions about what was found during the examination.  If the procedure report does not answer your questions, please call your gastroenterologist to clarify.  If you requested that your care partner not be given the details of your procedure findings, then the procedure report has been included in a sealed envelope for you to review at your convenience later.  YOU SHOULD EXPECT: Some feelings of bloating in the abdomen. Passage of more gas than usual.  Walking can help get rid of the air that was put into your GI tract during the procedure and reduce the bloating. If you had a lower endoscopy (such as a colonoscopy or flexible sigmoidoscopy) you may notice spotting of blood in your stool or on the toilet paper. If you underwent a bowel prep for your procedure, you may not have a normal bowel movement for a few days.  Please Note:  You might notice some irritation and congestion in your nose or some drainage.  This is from the oxygen used during your procedure.  There is no need for concern and it should clear up in a day or so.  SYMPTOMS TO REPORT IMMEDIATELY:  Following lower endoscopy (colonoscopy or flexible sigmoidoscopy):  Excessive amounts of blood in the stool  Significant tenderness or worsening of abdominal pains  Swelling of the abdomen that is new, acute  Fever of 100F or higher  Following upper endoscopy (EGD)  Vomiting of blood or coffee ground material  New chest pain or pain under the shoulder blades  Painful or persistently difficult swallowing  New shortness of breath  Fever of 100F or higher  Black, tarry-looking stools  For urgent or emergent issues, a  gastroenterologist can be reached at any hour by calling [phone]. Do not use MyChart messaging for urgent concerns.    DIET:  We do recommend a small meal at first, but then you may proceed to your regular diet.  Drink plenty of fluids but you should avoid alcoholic beverages for 24 hours.  ACTIVITY:  You should plan to take it easy for the rest of today and you should NOT DRIVE or use heavy machinery until tomorrow (because of the sedation medicines used during the test).    FOLLOW UP: Our staff will call the number listed on your records the next business day following your procedure.  We will call around 7:15- 8:00 am to check on you and address any questions or concerns that you may have regarding the information given to you following your procedure. If we do not reach you, we will leave a message.     If any biopsies were taken you will be contacted by phone or by letter within the next 1-3 weeks.  Please call us at [phone] if you have not heard about the biopsies in 3 weeks.    SIGNATURES/CONFIDENTIALITY: You and/or your care partner have signed paperwork which will be entered into your electronic medical record.  These signatures attest to the fact that that the information above on your After Visit Summary has been reviewed and is understood.  Full responsibility of the confidentiality of this discharge information lies with you and/or your care-partner.

## 2023-03-05 NOTE — Progress Notes (Signed)
Expand All Collapse All HISTORY OF PRESENT ILLNESS:   Vincent Eaton is a 77 y.o. male with multiple problems as listed below who is sent today by his primary care provider regarding mild iron deficiency anemia and the need for surveillance colonoscopy.   The patient was evaluated in this office August 15, 2014 regarding abdominal discomfort, abnormal laboratories, GERD, hemorrhoids, and a history of adenomatous colon polyps for which surveillance was due.   He subsequently underwent colonoscopy and upper endoscopy August 22, 2015.  Colonoscopy revealed none adenomatous colon polyps.  Due to his personal history, follow-up in 5 years recommended.  Upper endoscopy was normal.  Patient does have a history of GERD for which she is on pantoprazole.  Does take Advil regularly.  He is a type II diabetic on oral agents.  In terms of GI complaints, he does have a sensation that is difficult for him to evacuate his bowel.  He would not describe it as constipation.  Occasionally he has loose stools.  He states that he feels like he has "a blockage".  In June he was noted to have mild iron deficiency anemia with a hemoglobin of 11.2, MCV 81, and iron saturation 3%.  He was placed on daily iron supplement, which she continues to take.  Repeat blood work last week had normalized.  He denies melena or hematochezia.  He did undergo a contrast-enhanced CT scan of the abdomen and pelvis January 2023 to evaluate nausea, vomiting, and mid abdominal pain.  No acute abnormalities noted.   REVIEW OF SYSTEMS:   All non-GI ROS negative unless otherwise stated in the HPI except for seasonal allergies, anxiety.       Past Medical History:  Diagnosis Date   1st degree AV block     Allergy      seasonal   Anxiety     Anxiety and depression     Asthma     BPH (benign prostatic hyperplasia)     Colon polyps      adenomatous   Diabetic retinopathy (HCC)     Dysuria     GERD (gastroesophageal reflux disease)       WATCHES DIET   Hemangioma of liver     Hematuria     History of kidney stones     OSA (obstructive sleep apnea)     Peripheral neuropathy     Right ureteral stone     Trigger finger     Type 2 diabetes mellitus (HCC)     Ureterocele     Urgency of urination     Wears glasses                 Past Surgical History:  Procedure Laterality Date   COLONOSCOPY       CYSTOSCOPY/RETROGRADE/URETEROSCOPY Right 03/10/2013    Procedure: CYSTOSCOPY UNROOF URETEROCELE STONE EXTRACTION;  Surgeon: Bjorn Pippin, MD;  Location: [facility];  Service: Urology;  Laterality: Right;   EXTRACORPOREAL SHOCK WAVE LITHOTRIPSY Right 10-17-2010          Social History Vincent Eaton  reports that he quit smoking about 29 years ago. His smoking use included cigarettes. He started smoking about 49 years ago. He has a 5 pack-year smoking history. He has never used smokeless tobacco. He reports that he does not drink alcohol and does not use drugs.   family history includes Asthma in his father; Diabetes in his brother and sister; Healthy in his mother.   Allergies  No Known Allergies         PHYSICAL EXAMINATION: Vital signs: BP 130/62   Pulse 64   Ht 5\' 8"  (1.727 m)   Wt 148 lb (67.1 kg)   BMI 22.50 kg/m   Constitutional: generally well-appearing, no acute distress Psychiatric: alert and oriented x3, cooperative Eyes: extraocular movements intact, anicteric, conjunctiva pink Mouth: oral pharynx moist, no lesions Neck: supple no lymphadenopathy Cardiovascular: heart regular rate and rhythm, no murmur Lungs: clear to auscultation bilaterally Abdomen: soft, nontender, nondistended, no obvious ascites, no peritoneal signs, normal bowel sounds, no organomegaly Rectal: Deferred until colonoscopy Extremities: no clubbing, cyanosis, or lower extremity edema bilaterally Skin: no lesions on visible extremities Neuro: No focal deficits.  Cranial nerves intact    ASSESSMENT:   1.  Mild iron deficiency anemia.  Responded to oral iron.  Rule out GI mucosal lesion. 2.  Personal history of adenomatous colon polyps.  Due for surveillance 3.  Chronic GERD.  On pantoprazole 40 mg daily 4.  Chronic intermittent NSAID use 5.  General Medical problems including diabetes mellitus     PLAN:   1.  Schedule colonoscopy to evaluate iron deficiency anemia and provide neoplasia surveillance.The nature of the procedure, as well as the risks, benefits, and alternatives were carefully and thoroughly reviewed with the patient. Ample time for discussion and questions allowed. The patient understood, was satisfied, and agreed to proceed. 2.  Schedule upper endoscopy to evaluate iron deficiency anemia.The nature of the procedure, as well as the risks, benefits, and alternatives were carefully and thoroughly reviewed with the patient. Ample time for discussion and questions allowed. The patient understood, was satisfied, and agreed to proceed. 3.  Continue pantoprazole 40 mg daily 4.  Hold diabetic medications the day of the procedure to avoid unwanted hypoglycemia 5.  Hold iron supplement 1 week prior to the procedure to assist with bowel preparation 6.  Further recommendations to be determined after the above completed.

## 2023-03-05 NOTE — Progress Notes (Signed)
Called to room to assist during endoscopic procedure.  Patient ID and intended procedure confirmed with present staff. Received instructions for my participation in the procedure from the performing physician.  

## 2023-03-05 NOTE — Progress Notes (Signed)
Vss nad trans to pacu 

## 2023-03-05 NOTE — Op Note (Signed)
[facility] Patient Name: Vincent Eaton Procedure Date: 03/05/2023 1:27 PM MRN: [id number] Endoscopist: Wilhemina Bonito. Marina Goodell , MD, [id number] Age: 77 Referring MD:  Date of Birth: 1945/12/01 Gender: Male Account #: [id number] Procedure:                Colonoscopy Indications:              Iron deficiency anemia. History of tubular adenoma.                            Previous examinations 2007, 2010 and, 2017 Medicines:                Monitored Anesthesia Care Procedure:                Pre-Anesthesia Assessment:                           - Prior to the procedure, a History and Physical                            was performed, and patient medications and                            allergies were reviewed. The patient's tolerance of                            previous anesthesia was also reviewed. The risks                            and benefits of the procedure and the sedation                            options and risks were discussed with the patient.                            All questions were answered, and informed consent                            was obtained. Prior Anticoagulants: The patient has                            taken no anticoagulant or antiplatelet agents. ASA                            Grade Assessment: II - A patient with mild systemic                            disease. After reviewing the risks and benefits,                            the patient was deemed in satisfactory condition to                            undergo the procedure.  After obtaining informed consent, the colonoscope                            was passed under direct vision. Throughout the                            procedure, the patient's blood pressure, pulse, and                            oxygen saturations were monitored continuously. The                            CF [id number] #[id number] was introduced through the anus                            and  advanced to the the cecum, identified by                            appendiceal orifice and ileocecal valve. The                            ileocecal valve, appendiceal orifice, and rectum                            were photographed. The quality of the bowel                            preparation was excellent. The colonoscopy was                            performed without difficulty. The patient tolerated                            the procedure well. The bowel preparation used was                            SUPREP via split dose instruction. Scope In: 1:45:18 PM Scope Out: 1:59:10 PM Scope Withdrawal Time: 0 hours 8 minutes 42 seconds  Total Procedure Duration: 0 hours 13 minutes 52 seconds  Findings:                 A few diverticula were found in the sigmoid colon.                           Hemorrhoids were found during retroflexion.                           The exam was otherwise without abnormality on                            direct and retroflexion views. Complications:            No immediate complications. Estimated blood loss:  None. Estimated Blood Loss:     Estimated blood loss: none. Impression:               - Diverticulosis in the sigmoid colon.                           - The examination was otherwise normal on direct                            and retroflexion views.                           - No specimens collected. Recommendation:           - Repeat colonoscopy is not recommended for                            surveillance.                           - Patient has a contact number available for                            emergencies. The signs and symptoms of potential                            delayed complications were discussed with the                            patient. Return to normal activities tomorrow.                            Written discharge instructions were provided to the                            patient.                            - Resume previous diet.                           - Continue present medications. Wilhemina Bonito. Marina Goodell, MD 03/05/2023 2:16:20 PM This report has been signed electronically.

## 2023-03-05 NOTE — Op Note (Signed)
[facility] Patient Name: Vincent Eaton Procedure Date: 03/05/2023 1:26 PM MRN: [id number] Endoscopist: Wilhemina Bonito. Marina Goodell , MD, [id number] Age: 77 Referring MD:  Date of Birth: 10-04-1945 Gender: Male Account #: [id number] Procedure:                Upper GI endoscopy with biopsies Indications:              Iron deficiency anemia Medicines:                Monitored Anesthesia Care Procedure:                Pre-Anesthesia Assessment:                           - Prior to the procedure, a History and Physical                            was performed, and patient medications and                            allergies were reviewed. The patient's tolerance of                            previous anesthesia was also reviewed. The risks                            and benefits of the procedure and the sedation                            options and risks were discussed with the patient.                            All questions were answered, and informed consent                            was obtained. Prior Anticoagulants: The patient has                            taken no anticoagulant or antiplatelet agents. ASA                            Grade Assessment: II - A patient with mild systemic                            disease. After reviewing the risks and benefits,                            the patient was deemed in satisfactory condition to                            undergo the procedure.                           After obtaining informed consent, the endoscope was  passed under direct vision. Throughout the                            procedure, the patient's blood pressure, pulse, and                            oxygen saturations were monitored continuously. The                            Olympus scope [id number] was introduced through the                            mouth, and advanced to the second part of duodenum.                            The upper GI  endoscopy was accomplished without                            difficulty. The patient tolerated the procedure                            well. Scope In: Scope Out: Findings:                 The esophagus was normal.                           The stomach revealed erythematous changes in the                            antrum (see images) which may represent GAVE.Marland Kitchen                            Biopsies were taken with a cold forceps for                            histology.                           The examined duodenum was normal.                           The cardia and gastric fundus were normal on                            retroflexion. Complications:            No immediate complications. Estimated Blood Loss:     Estimated blood loss: none. Impression:               1. Possible GAVE status post biopsies                           2. Otherwise normal EGD. Recommendation:           - Patient has a contact number available for  emergencies. The signs and symptoms of potential                            delayed complications were discussed with the                            patient. Return to normal activities tomorrow.                            Written discharge instructions were provided to the                            patient.                           - Resume previous diet.                           - Continue present medications.                           - Await pathology results.                           - Take entirely an iron supplement once daily                           - Return to the care of your primary provider Wilhemina Bonito. Marina Goodell, MD 03/05/2023 2:19:52 PM This report has been signed electronically.

## 2023-03-06 ENCOUNTER — Telehealth: Payer: Self-pay

## 2023-03-06 NOTE — Telephone Encounter (Signed)
Left message on answering machine. 

## 2023-03-10 ENCOUNTER — Encounter: Payer: Self-pay | Admitting: Internal Medicine

## 2023-03-10 LAB — SURGICAL PATHOLOGY

## 2023-03-31 DIAGNOSIS — E039 Hypothyroidism, unspecified: Secondary | ICD-10-CM | POA: Diagnosis not present

## 2023-05-11 DIAGNOSIS — M25551 Pain in right hip: Secondary | ICD-10-CM | POA: Diagnosis not present

## 2023-05-11 DIAGNOSIS — R531 Weakness: Secondary | ICD-10-CM | POA: Diagnosis not present

## 2023-05-11 DIAGNOSIS — M5416 Radiculopathy, lumbar region: Secondary | ICD-10-CM | POA: Diagnosis not present

## 2023-05-14 DIAGNOSIS — M25551 Pain in right hip: Secondary | ICD-10-CM | POA: Diagnosis not present

## 2023-05-14 DIAGNOSIS — M5416 Radiculopathy, lumbar region: Secondary | ICD-10-CM | POA: Diagnosis not present

## 2023-05-14 DIAGNOSIS — R531 Weakness: Secondary | ICD-10-CM | POA: Diagnosis not present

## 2023-06-04 DIAGNOSIS — R531 Weakness: Secondary | ICD-10-CM | POA: Diagnosis not present

## 2023-06-04 DIAGNOSIS — M5416 Radiculopathy, lumbar region: Secondary | ICD-10-CM | POA: Diagnosis not present

## 2023-06-04 DIAGNOSIS — M25551 Pain in right hip: Secondary | ICD-10-CM | POA: Diagnosis not present

## 2023-06-13 ENCOUNTER — Emergency Department (HOSPITAL_COMMUNITY)
Admission: EM | Admit: 2023-06-13 | Discharge: 2023-06-13 | Disposition: A | Discharge status: Home / Self Care | Payer: Medicare PPO | Attending: Emergency Medicine | Admitting: Emergency Medicine

## 2023-06-13 ENCOUNTER — Other Ambulatory Visit: Payer: Self-pay

## 2023-06-13 DIAGNOSIS — X30XXXA Exposure to excessive natural heat, initial encounter: Secondary | ICD-10-CM | POA: Diagnosis not present

## 2023-06-13 DIAGNOSIS — R509 Fever, unspecified: Secondary | ICD-10-CM | POA: Diagnosis not present

## 2023-06-13 DIAGNOSIS — R404 Transient alteration of awareness: Secondary | ICD-10-CM | POA: Diagnosis not present

## 2023-06-13 DIAGNOSIS — T679XXA Effect of heat and light, unspecified, initial encounter: Secondary | ICD-10-CM

## 2023-06-13 DIAGNOSIS — T675XXA Heat exhaustion, unspecified, initial encounter: Secondary | ICD-10-CM | POA: Diagnosis not present

## 2023-06-13 DIAGNOSIS — R55 Syncope and collapse: Secondary | ICD-10-CM | POA: Diagnosis not present

## 2023-06-13 DIAGNOSIS — E119 Type 2 diabetes mellitus without complications: Secondary | ICD-10-CM | POA: Insufficient documentation

## 2023-06-13 DIAGNOSIS — R001 Bradycardia, unspecified: Secondary | ICD-10-CM | POA: Diagnosis not present

## 2023-06-13 LAB — COMPREHENSIVE METABOLIC PANEL
ALT: 15 U/L (ref 0–44)
AST: 26 U/L (ref 15–41)
Albumin: 4.1 g/dL (ref 3.5–5.0)
Alkaline Phosphatase: 52 U/L (ref 38–126)
Anion gap: 9 (ref 5–15)
BUN: 28 mg/dL — ABNORMAL HIGH (ref 8–23)
CO2: 22 mmol/L (ref 22–32)
Calcium: 9.2 mg/dL (ref 8.9–10.3)
Chloride: 106 mmol/L (ref 98–111)
Creatinine, Ser: 1.08 mg/dL (ref 0.61–1.24)
GFR, Estimated: >60 mL/min (ref >60)
Glucose, Bld: 150 mg/dL — ABNORMAL HIGH (ref 70–99)
Potassium: 4.7 mmol/L (ref 3.5–5.1)
Sodium: 137 mmol/L (ref 135–145)
Total Bilirubin: 0.6 mg/dL (ref 0.0–1.2)
Total Protein: 6.6 g/dL (ref 6.5–8.1)

## 2023-06-13 LAB — CBC
HCT: 41.6 % (ref 39.0–52.0)
Hemoglobin: 13.1 g/dL (ref 13.0–17.0)
MCH: 30.5 pg (ref 26.0–34.0)
MCHC: 31.5 g/dL (ref 30.0–36.0)
MCV: 96.7 fL (ref 80.0–100.0)
Platelets: 136 10*3/uL — ABNORMAL LOW (ref 150–400)
RBC: 4.3 MIL/uL (ref 4.22–5.81)
RDW: 12.5 % (ref 11.5–15.5)
WBC: 8.8 10*3/uL (ref 4.0–10.5)
nRBC: 0 % (ref 0.0–0.2)

## 2023-06-13 LAB — CK: Total CK: 261 U/L (ref 49–397)

## 2023-06-13 MED ORDER — SODIUM CHLORIDE 0.9 % IV BOLUS
1000.0000 mL | Freq: Once | INTRAVENOUS | Status: AC
  Administered 2023-06-13: 1000 mL via INTRAVENOUS

## 2023-06-13 NOTE — Discharge Instructions (Signed)
 Make sure to drink plenty of fluids.  Your symptoms should not return.  If they do please return to the ED for further evaluation

## 2023-06-13 NOTE — ED Triage Notes (Signed)
 BIBA from [facility] for being unresponsive after overheating in the steam room. After being removed and having ice packs placed by EMS, pt states he feels better. 142/68 BP 104 HR 100% 210 cbg

## 2023-06-13 NOTE — ED Provider Notes (Signed)
 [place] [facility] AT [facility] Provider Note   CSN: [id number] Arrival date & time: 06/13/23  1546     History  Chief Complaint  Patient presents with   Heat Exposure    Vincent Eaton is a 78 y.o. male.  HPI   Patient has a history of kidney stones diabetes acid reflux peripheral neuropathy, obstructive sleep apnea.  He presents to the ED for evaluation of a syncopal episode.  Patient states he was in the steam room at the [facility].  He states he was in there probably longer than he should have been.  He thinks he might of been there for about an hour.  Patient states he was getting up to leave when he had a syncopal episode.  Patient does not exactly remember what happened.  He states the staff told him he had vomited.  He reportedly was removed from the steam room and had ice packs placed on him.  He states he feels fine now.  Home Medications Prior to Admission medications   Medication Sig Start Date End Date Taking? Authorizing Provider  B Complex-C-Folic Acid (B COMPLEX-VITAMIN C-FOLIC ACID) 1 MG tablet Take 1 tablet by mouth daily with breakfast.    [provider]  fluticasone  (FLONASE ) 50 MCG/ACT nasal spray Place 1 spray into both nostrils as needed.  11/21/13   [provider]  ibuprofen (ADVIL,MOTRIN) 200 MG tablet Take 200 mg by mouth every 6 (six) hours as needed for headache, mild pain or moderate pain.     [provider]  levothyroxine (SYNTHROID) 25 MCG tablet Take 25 mcg by mouth daily. 04/16/22   [provider]  pantoprazole (PROTONIX) 40 MG tablet Take 40 mg by mouth daily. 03/01/22   [provider]  pregabalin (LYRICA) 100 MG capsule Take 100 mg by mouth 3 (three) times daily. 04/21/22   [provider]  sertraline (ZOLOFT) 100 MG tablet Take 100 mg by mouth daily.    [provider]  SYNJARDY XR 09-998 MG TB24 Take 1,000 mg by mouth 2 (two) times daily. 04/21/22    [provider]  traZODone (DESYREL) 50 MG tablet Take 50 mg by mouth at bedtime.     [provider]  VENTOLIN HFA 108 (90 BASE) MCG/ACT inhaler Inhale 2 puffs into the lungs as needed for wheezing or shortness of breath.  12/21/13   [provider]      Allergies    Patient has no known allergies.    Review of Systems   Review of Systems  Physical Exam Updated Vital Signs BP (!) 123/57   Pulse (!) 58   Temp (!) 97.5 F (36.4 C) (Oral)   Resp 12   SpO2 100%  Physical Exam Vitals and nursing note reviewed.  Constitutional:      Appearance: He is well-developed. He is not diaphoretic.  HENT:     Head: Normocephalic and atraumatic.     Right Ear: External ear normal.     Left Ear: External ear normal.  Eyes:     General: No scleral icterus.       Right eye: No discharge.        Left eye: No discharge.     Conjunctiva/sclera: Conjunctivae normal.  Neck:     Trachea: No tracheal deviation.  Cardiovascular:     Rate and Rhythm: Normal rate and regular rhythm.  Pulmonary:     Effort: Pulmonary effort is normal. No respiratory distress.  Breath sounds: Normal breath sounds. No stridor. No wheezing or rales.  Abdominal:     General: Bowel sounds are normal. There is no distension.     Palpations: Abdomen is soft.     Tenderness: There is no abdominal tenderness. There is no guarding or rebound.  Musculoskeletal:        General: No tenderness or deformity.     Cervical back: Neck supple.  Skin:    General: Skin is warm and dry.     Findings: No rash.  Neurological:     General: No focal deficit present.     Mental Status: He is alert.     Cranial Nerves: No cranial nerve deficit, dysarthria or facial asymmetry.     Sensory: No sensory deficit.     Motor: No abnormal muscle tone or seizure activity.     Coordination: Coordination normal.  Psychiatric:        Mood and Affect: Mood normal.     ED Results / Procedures / Treatments    Labs (all labs ordered are listed, but only abnormal results are displayed) Labs Reviewed  CBC - Abnormal; Notable for the following components:      Result Value   Platelets 136 (*)    All other components within normal limits  COMPREHENSIVE METABOLIC PANEL - Abnormal; Notable for the following components:   Glucose, Bld 150 (*)    BUN 28 (*)    All other components within normal limits  CK    EKG EKG Interpretation Date/Time:  Saturday June 13 2023 16:44:19 EST Ventricular Rate:  62 PR Interval:  181 QRS Duration:  84 QT Interval:  399 QTC Calculation: 406 R Axis:   73  Text Interpretation: Sinus rhythm Low voltage, extremity leads Early repolarization No significant change since last tracing Confirmed by Randol Simmonds [phone]) on 06/13/2023 5:26:06 PM  Radiology No results found.  Procedures Procedures    Medications Ordered in ED Medications  sodium chloride  0.9 % bolus 1,000 mL (1,000 mLs Intravenous New Bag/Given 06/13/23 1649)    ED Course/ Medical Decision Making/ A&P Clinical Course as of 06/13/23 1740  Sat Jun 13, 2023  1725 CK normal.  Metabolic panel shows BUN increased at 28.  CBC normal [JK]    Clinical Course User Index [JK] Randol Simmonds, MD                                 Medical Decision Making Problems Addressed: Heat exposure, initial encounter: acute illness or injury that poses a threat to life or bodily functions Syncope, unspecified syncope type: acute illness or injury that poses a threat to life or bodily functions  Amount and/or Complexity of Data Reviewed Labs: ordered. Decision-making details documented in ED Course.   Patient presented to the ED for evaluation after having a syncopal episode.  Patient had been in the steam room for about an hour.  He states he was getting up to walk out and leave when he became lightheaded and passed out.  Patient has no hyperthermia here.  I suspect he became dehydrated from being in the steam room  for an hour rather than having severe heat injury.  Patient's laboratory tests are unremarkable.  No signs of severe dehydration.  He is not anemic.  No cardiac dysrhythmia noted.  Patient has remained stable in the ED without any recurrent episodes.  He has been able to drink fluids without  any difficulty.  Evaluation and diagnostic testing in the emergency department does not suggest an emergent condition requiring admission or immediate intervention beyond what has been performed at this time.  The patient is safe for discharge and has been instructed to return immediately for worsening symptoms, change in symptoms or any other concerns.        Final Clinical Impression(s) / ED Diagnoses Final diagnoses:  Syncope, unspecified syncope type  Heat exposure, initial encounter    Rx / DC Orders ED Discharge Orders     None         Randol Simmonds, MD 06/13/23 1744

## 2023-07-08 DIAGNOSIS — E119 Type 2 diabetes mellitus without complications: Secondary | ICD-10-CM | POA: Diagnosis not present

## 2023-07-08 DIAGNOSIS — H2513 Age-related nuclear cataract, bilateral: Secondary | ICD-10-CM | POA: Diagnosis not present

## 2023-07-08 DIAGNOSIS — H52203 Unspecified astigmatism, bilateral: Secondary | ICD-10-CM | POA: Diagnosis not present

## 2023-07-22 DIAGNOSIS — E0840 Diabetes mellitus due to underlying condition with diabetic neuropathy, unspecified: Secondary | ICD-10-CM | POA: Diagnosis not present

## 2023-07-22 DIAGNOSIS — E119 Type 2 diabetes mellitus without complications: Secondary | ICD-10-CM | POA: Diagnosis not present

## 2023-07-22 DIAGNOSIS — D696 Thrombocytopenia, unspecified: Secondary | ICD-10-CM | POA: Diagnosis not present

## 2023-07-22 DIAGNOSIS — R0781 Pleurodynia: Secondary | ICD-10-CM | POA: Diagnosis not present

## 2023-07-22 DIAGNOSIS — N1831 Chronic kidney disease, stage 3a: Secondary | ICD-10-CM | POA: Diagnosis not present

## 2023-10-14 DIAGNOSIS — N1831 Chronic kidney disease, stage 3a: Secondary | ICD-10-CM | POA: Diagnosis not present

## 2023-10-14 DIAGNOSIS — D696 Thrombocytopenia, unspecified: Secondary | ICD-10-CM | POA: Diagnosis not present

## 2023-10-14 DIAGNOSIS — E1139 Type 2 diabetes mellitus with other diabetic ophthalmic complication: Secondary | ICD-10-CM | POA: Diagnosis not present

## 2023-10-14 DIAGNOSIS — E0840 Diabetes mellitus due to underlying condition with diabetic neuropathy, unspecified: Secondary | ICD-10-CM | POA: Diagnosis not present

## 2023-10-19 DIAGNOSIS — Z0000 Encounter for general adult medical examination without abnormal findings: Secondary | ICD-10-CM | POA: Diagnosis not present

## 2023-10-19 DIAGNOSIS — J309 Allergic rhinitis, unspecified: Secondary | ICD-10-CM | POA: Diagnosis not present

## 2023-10-19 DIAGNOSIS — E1142 Type 2 diabetes mellitus with diabetic polyneuropathy: Secondary | ICD-10-CM | POA: Diagnosis not present

## 2023-10-19 DIAGNOSIS — R490 Dysphonia: Secondary | ICD-10-CM | POA: Diagnosis not present

## 2023-10-19 DIAGNOSIS — E039 Hypothyroidism, unspecified: Secondary | ICD-10-CM | POA: Diagnosis not present

## 2023-10-19 DIAGNOSIS — N401 Enlarged prostate with lower urinary tract symptoms: Secondary | ICD-10-CM | POA: Diagnosis not present

## 2023-10-19 DIAGNOSIS — F419 Anxiety disorder, unspecified: Secondary | ICD-10-CM | POA: Diagnosis not present

## 2023-10-19 DIAGNOSIS — I440 Atrioventricular block, first degree: Secondary | ICD-10-CM | POA: Diagnosis not present

## 2023-10-19 DIAGNOSIS — E0840 Diabetes mellitus due to underlying condition with diabetic neuropathy, unspecified: Secondary | ICD-10-CM | POA: Diagnosis not present

## 2023-10-21 ENCOUNTER — Encounter (INDEPENDENT_AMBULATORY_CARE_PROVIDER_SITE_OTHER): Payer: Self-pay

## 2023-11-18 ENCOUNTER — Encounter (INDEPENDENT_AMBULATORY_CARE_PROVIDER_SITE_OTHER): Payer: Self-pay

## 2023-12-16 ENCOUNTER — Ambulatory Visit: Admitting: Pulmonary Disease

## 2023-12-16 DIAGNOSIS — R0989 Other specified symptoms and signs involving the circulatory and respiratory systems: Secondary | ICD-10-CM

## 2023-12-16 LAB — PULMONARY FUNCTION TEST
DL/VA % pred: 96 %
DL/VA: 3.84 ml/min/mmHg/L
DLCO cor % pred: 98 %
DLCO cor: 22.91 ml/min/mmHg
DLCO unc % pred: 98 %
DLCO unc: 22.91 ml/min/mmHg
FEF 25-75 Post: 3.3 L/s
FEF 25-75 Pre: 2.05 L/s
FEF2575-%Change-Post: 60 %
FEF2575-%Pred-Post: 170 %
FEF2575-%Pred-Pre: 105 %
FEV1-%Change-Post: 10 %
FEV1-%Pred-Post: 118 %
FEV1-%Pred-Pre: 107 %
FEV1-Post: 3.25 L
FEV1-Pre: 2.94 L
FEV1FVC-%Change-Post: 6 %
FEV1FVC-%Pred-Pre: 100 %
FEV6-%Change-Post: 2 %
FEV6-%Pred-Post: 116 %
FEV6-%Pred-Pre: 113 %
FEV6-Post: 4.16 L
FEV6-Pre: 4.04 L
FEV6FVC-%Change-Post: 0 %
FEV6FVC-%Pred-Post: 106 %
FEV6FVC-%Pred-Pre: 106 %
FVC-%Change-Post: 4 %
FVC-%Pred-Post: 110 %
FVC-%Pred-Pre: 106 %
FVC-Post: 4.24 L
FVC-Pre: 4.07 L
Post FEV1/FVC ratio: 77 %
Post FEV6/FVC ratio: 99 %
Pre FEV1/FVC ratio: 72 %
Pre FEV6/FVC Ratio: 99 %
RV % pred: 116 %
RV: 2.9 L
TLC % pred: 108 %
TLC: 7.19 L

## 2023-12-16 NOTE — Progress Notes (Signed)
 Full PFT performed today.

## 2023-12-16 NOTE — Patient Instructions (Signed)
 Full PFT performed today.

## 2023-12-18 ENCOUNTER — Ambulatory Visit: Payer: Self-pay | Admitting: Pulmonary Disease

## 2024-01-21 DIAGNOSIS — Z23 Encounter for immunization: Secondary | ICD-10-CM | POA: Diagnosis not present

## 2024-01-21 DIAGNOSIS — E1142 Type 2 diabetes mellitus with diabetic polyneuropathy: Secondary | ICD-10-CM | POA: Diagnosis not present

## 2024-01-21 DIAGNOSIS — E0840 Diabetes mellitus due to underlying condition with diabetic neuropathy, unspecified: Secondary | ICD-10-CM | POA: Diagnosis not present

## 2024-01-22 ENCOUNTER — Ambulatory Visit (INDEPENDENT_AMBULATORY_CARE_PROVIDER_SITE_OTHER): Admitting: Otolaryngology

## 2024-01-22 VITALS — BP 122/56 | HR 67 | Temp 97.8°F | Ht 68.0 in | Wt 137.0 lb

## 2024-01-22 DIAGNOSIS — Z87891 Personal history of nicotine dependence: Secondary | ICD-10-CM

## 2024-01-22 DIAGNOSIS — R0981 Nasal congestion: Secondary | ICD-10-CM

## 2024-01-22 DIAGNOSIS — R0982 Postnasal drip: Secondary | ICD-10-CM

## 2024-01-22 DIAGNOSIS — H6990 Unspecified Eustachian tube disorder, unspecified ear: Secondary | ICD-10-CM | POA: Diagnosis not present

## 2024-01-22 DIAGNOSIS — J3089 Other allergic rhinitis: Secondary | ICD-10-CM

## 2024-01-22 DIAGNOSIS — J383 Other diseases of vocal cords: Secondary | ICD-10-CM | POA: Diagnosis not present

## 2024-01-22 DIAGNOSIS — R490 Dysphonia: Secondary | ICD-10-CM | POA: Diagnosis not present

## 2024-01-22 DIAGNOSIS — H6993 Unspecified Eustachian tube disorder, bilateral: Secondary | ICD-10-CM

## 2024-01-22 MED ORDER — FLUTICASONE PROPIONATE 50 MCG/ACT NA SUSP: 2.0000 | Freq: Every day | NASAL | 6 refills | Status: AC

## 2024-01-22 MED ORDER — LEVOCETIRIZINE DIHYDROCHLORIDE 5 MG PO TABS: 5.0000 mg | ORAL_TABLET | Freq: Every evening | ORAL | 3 refills | Status: AC

## 2024-01-22 NOTE — Patient Instructions (Signed)

## 2024-01-22 NOTE — Progress Notes (Signed)
 ENT CONSULT:  Reason for Consult: chronic hoarseness   HPI: Discussed the use of AI scribe software for clinical note transcription with the patient, who gave verbal consent to proceed.  History of Present Illness Vincent Eaton is a 78 year old male who presents with hoarseness and ear congestion worse on the left.  He experiences hoarseness. He has not undergone any prior treatments specifically for this issue.  He reports pain in his forehead, particularly around his eye, which worsens with pressure. This is described as a headache located in the forehead area.   He experiences congestion in his left ear, describing it as feeling 'plugged' and affecting his hearing, particularly when he sleeps or talks. He uses Flonase  nasal spray for occasional nasal congestion but is not currently on any allergy pills.  A CT head and neck scan in March 2024 showed no evidence of chronic sinusitis. He reports nasal congestion and ear plugging, particularly in the left ear. He does not currently take allergy pills.   Records Reviewed:  ED visit for syncope 06/13/23 Patient has a history of kidney stones diabetes acid reflux peripheral neuropathy, obstructive sleep apnea. He presents to the ED for evaluation of a syncopal episode. Patient states he was in the steam room at the [facility]. He states he was in there probably longer than he should have been. He thinks he might of been there for about an hour. Patient states he was getting up to leave when he had a syncopal episode. Patient does not exactly remember what happened. He states the staff told him he had vomited. He reportedly was removed from the steam room and had ice packs placed on him. He states he feels fine now.     Past Medical History:  Diagnosis Date   1st degree AV block    Allergy    seasonal   Anxiety    Anxiety and depression    Asthma    BPH (benign prostatic hyperplasia)    Colon polyps    adenomatous    Diabetic retinopathy (HCC)    Dysuria    GERD (gastroesophageal reflux disease)    WATCHES DIET   Hemangioma of liver    Hematuria    History of kidney stones    OSA (obstructive sleep apnea)    Peripheral neuropathy    Right ureteral stone    Trigger finger    Type 2 diabetes mellitus (HCC)    Ureterocele    Urgency of urination    Wears glasses     Past Surgical History:  Procedure Laterality Date   COLONOSCOPY     CYSTOSCOPY/RETROGRADE/URETEROSCOPY Right 03/10/2013   Procedure: CYSTOSCOPY UNROOF URETEROCELE STONE EXTRACTION;  Surgeon: Norleen Seltzer, MD;  Location: [facility];  Service: Urology;  Laterality: Right;   EXTRACORPOREAL SHOCK WAVE LITHOTRIPSY Right 10-17-2010    Family History  Problem Relation Age of Onset   Healthy Mother        died of old age at 61   Asthma Father        died at 55   Diabetes Sister    Diabetes Brother    Colon cancer Neg Hx    Liver disease Neg Hx    Esophageal cancer Neg Hx     Social History:  reports that he quit smoking about 30 years ago. His smoking use included cigarettes. He started smoking about 50 years ago. He has a 5 pack-year smoking history. He has never used smokeless tobacco.  He reports that he does not drink alcohol and does not use drugs.  Allergies: No Known Allergies  Medications: I have reviewed the patient's current medications.  The PMH, PSH, Medications, Allergies, and SH were reviewed and updated.  ROS: Constitutional: Negative for fever, weight loss and weight gain. Cardiovascular: Negative for chest pain and dyspnea on exertion. Respiratory: Is not experiencing shortness of breath at rest. Gastrointestinal: Negative for nausea and vomiting. Neurological: Negative for headaches. Psychiatric: The patient is not nervous/anxious  Blood pressure (!) 122/56, pulse 67, temperature 97.8 F (36.6 C), height 5' 8 (1.727 m), weight 137 lb (62.1 kg), SpO2 96%. Body mass index is 20.83  kg/m.  PHYSICAL EXAM:  Exam: General: Well-developed, well-nourished Respiratory Respiratory effort: Equal inspiration and expiration without stridor Cardiovascular Peripheral Vascular: Warm extremities with equal color/perfusion Eyes: No nystagmus with equal extraocular motion bilaterally Neuro/Psych/Balance: Patient oriented to person, place, and time; Appropriate mood and affect; Gait is intact with no imbalance; Cranial nerves I-XII are intact Head and Face Inspection: Normocephalic and atraumatic without mass or lesion Palpation: Facial skeleton intact without bony stepoffs Salivary Glands: No mass or tenderness Facial Strength: Facial motility symmetric and full bilaterally ENT Pinna: External ear intact and fully developed External canal: Canal is patent with intact skin Tympanic Membrane: Clear and mobile External Nose: No scar or anatomic deformity Internal Nose: Septum is straight. No polyp, or purulence. Mucosal edema and erythema present.  Bilateral inferior turbinate hypertrophy.  Lips, Teeth, and gums: Mucosa and teeth intact and viable TMJ: No pain to palpation with full mobility Oral cavity/oropharynx: No erythema or exudate, no lesions present Nasopharynx: No mass or lesion with intact mucosa Hypopharynx: Intact mucosa without pooling of secretions Larynx Glottic: Full true vocal cord mobility without lesion or mass VF atrophy and glottic insufficiency Supraglottic: Normal appearing epiglottis and AE folds Interarytenoid Space: Moderate pachydermia&edema Subglottic Space: Patent without lesion or edema Neck Neck and Trachea: Midline trachea without mass or lesion Thyroid : No mass or nodularity Lymphatics: No lymphadenopathy  Procedure:  Preoperative diagnosis: hoarseness  Postoperative diagnosis:   same + GERD LPR and VF atrophy  Procedure: Flexible fiberoptic laryngoscopy with stroboscopy (68420)   Surgeon: Elena Larry, MD  Anesthesia: Topical  lidocaine  and Afrin  Complications: None  Condition is stable throughout exam  Indications and consent:   The patient presents to the clinic with hoarseness. All the risks, benefits, and potential complications were reviewed with the patient preoperatively and informed verbal consent was obtained.  Procedure: The patient was seated upright in the exam chair.   Topical lidocaine  and Afrin were applied to the nasal cavity. After adequate anesthesia had occurred, the flexible telescope with strobe capabilities was passed into the nasal cavity. The nasopharynx was patent without mass or lesion. The scope was passed behind the soft palate and directed toward the base of tongue. The base of tongue was visualized and was symmetric with no apparent masses or abnormal appearing tissue. There were no signs of a mass or pooling of secretions in the piriform sinuses. The supraglottic structures were normal.  The true vocal cords are mobile. The medial edges were bowed. Closure was incomplete. Periodicity present. The mucosal wave and amplitude were normal and intact. There is moderate interarytenoid pachydermia and post cricoid edema.  The laryngoscope was then slowly withdrawn and the patient tolerated the procedure well. There were no complications or blood loss.  Studies Reviewed: CT head and neck angio - 07/09/22 - reviewed no paranasal sinus disease or chronic  sinusitis    Assessment/Plan: Encounter Diagnoses  Name Primary?   Glottic insufficiency Yes   Dysphonia    Hoarseness    Environmental and seasonal allergies    Chronic nasal congestion    Post-nasal drip    Dysfunction of both eustachian tubes     Assessment and Plan Assessment & Plan Chronic dysphonia Vocal cords with atrophy and incomplete glottic closure on exam today, consistent with age-related atrophy. No tumors or lesions.  - Refer to speech therapy  - Discussed filler injection option for temporary relief, not interested at  this time  Eustachian tube dysfunction with chronic nasal congestion Eustachian tube dysfunction due to nasal congestion. No sinus disease on previous CT scan.  - Prescribed Flonase  nasal spray. - Prescribed Xyzal  allergy pill 5 mg daily - Provide information sheet on Eustachian tube dysfunction. - Offer referral to allergist for allergy testing. He would like to hold off      Thank you for allowing me to participate in the care of this patient. Please do not hesitate to contact me with any questions or concerns.   Elena Larry, MD Otolaryngology [facility] Phone: [phone] Fax: [phone]    01/22/2024, 10:24 AM

## 2024-02-02 ENCOUNTER — Ambulatory Visit: Admitting: Pulmonary Disease

## 2024-02-02 ENCOUNTER — Encounter: Payer: Self-pay | Admitting: Pulmonary Disease

## 2024-02-02 VITALS — BP 122/60 | HR 73 | Temp 97.5°F | Ht 68.0 in | Wt 134.0 lb

## 2024-02-02 DIAGNOSIS — J4540 Moderate persistent asthma, uncomplicated: Secondary | ICD-10-CM

## 2024-02-02 DIAGNOSIS — J45909 Unspecified asthma, uncomplicated: Secondary | ICD-10-CM

## 2024-02-02 DIAGNOSIS — R490 Dysphonia: Secondary | ICD-10-CM | POA: Diagnosis not present

## 2024-02-02 LAB — CBC WITH DIFFERENTIAL/PLATELET
Basophils Absolute: 0 K/uL (ref 0.0–0.1)
Basophils Relative: 0.6 % (ref 0.0–3.0)
Eosinophils Absolute: 0.3 K/uL (ref 0.0–0.7)
Eosinophils Relative: 6.4 % — ABNORMAL HIGH (ref 0.0–5.0)
HCT: 44.9 % (ref 39.0–52.0)
Hemoglobin: 14.9 g/dL (ref 13.0–17.0)
Lymphocytes Relative: 22.8 % (ref 12.0–46.0)
Lymphs Abs: 1.2 K/uL (ref 0.7–4.0)
MCHC: 33.1 g/dL (ref 30.0–36.0)
MCV: 94 fl (ref 78.0–100.0)
Monocytes Absolute: 0.5 K/uL (ref 0.1–1.0)
Monocytes Relative: 9.1 % (ref 3.0–12.0)
Neutro Abs: 3.2 K/uL (ref 1.4–7.7)
Neutrophils Relative %: 61.1 % (ref 43.0–77.0)
Platelets: 215 K/uL (ref 150.0–400.0)
RBC: 4.78 Mil/uL (ref 4.22–5.81)
RDW: 13.1 % (ref 11.5–15.5)
WBC: 5.2 K/uL (ref 4.0–10.5)

## 2024-02-02 LAB — POCT EXHALED NITRIC OXIDE: FeNO level (ppb): 25

## 2024-02-02 MED ORDER — BUDESONIDE-FORMOTEROL FUMARATE 160-4.5 MCG/ACT IN AERO: 2.0000 | INHALATION_SPRAY | Freq: Two times a day (BID) | RESPIRATORY_TRACT | 11 refills | Status: AC

## 2024-02-02 NOTE — Patient Instructions (Signed)
  VISIT SUMMARY: You visited us  today due to hoarseness and chest congestion. We evaluated your asthma and pulmonary symptoms and made some adjustments to your treatment plan.  YOUR PLAN: ASTHMA: You have mild asthma with intermittent symptoms. Your lung function tests are normal, but there are signs that suggest possible reactive airway disease or asthma.  Although previous lung imaging showed hyperinflation there is no COPD. -Start using Symbicort inhaler, two puffs in the morning and two puffs in the evening, for three months. -We will do blood tests to check for inflammation related to asthma. -We will perform a FENO test to assess lung inflammation. -Schedule a follow-up appointment in three months to evaluate how well the treatment is working.

## 2024-02-02 NOTE — Progress Notes (Signed)
 Vincent Eaton    [id number]    1945/10/14  Primary Care Physician:Pharr, Vincent Eaton  Referring Physician: Clarice Vincent Eaton [address] [place],  [place] [address]  Chief complaint: Follow-up for hyperinflation, asthma  HPI: 78 y.o. who  has a past medical history of 1st degree AV block, Allergy, Anxiety, Anxiety and depression, Asthma, BPH (benign prostatic hyperplasia), Colon polyps, Diabetic retinopathy (HCC), Dysuria, GERD (gastroesophageal reflux disease), Hemangioma of liver, Hematuria, History of kidney stones, OSA (obstructive sleep apnea), Peripheral neuropathy, Right ureteral stone, Trigger finger, Type 2 diabetes mellitus (HCC), Ureterocele, Urgency of urination, and Wears glasses.   He has history of mild asthma and is using albuterol as needed.  He is not on any controller medication.  He recently had a CT head and neck which was read as possible hyperinflation of the lungs in the apices and has been referred here for further evaluation.  He has occasional cough, white clear mucus and chest congestion.  Interval history: Discussed the use of AI scribe software for clinical note transcription with the patient, who gave verbal consent to proceed.  History of Present Illness Vincent Eaton is a 78 year old male who presents with hoarseness and chest congestion.   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  No  feather pillows or comforters Smoking history: 5-pack-year smoker.  Quit in 1997 Travel history: Originally from [place].  No significant recent travel Relevant family history: No family history of lung disease  Outpatient Encounter Medications as of 02/02/2024  Medication Sig   B Complex-C-Folic Acid (B COMPLEX-VITAMIN C-FOLIC ACID) 1 MG tablet Take 1 tablet by mouth daily with breakfast.   fluticasone  (FLONASE ) 50 MCG/ACT nasal spray Place 1 spray into both nostrils as needed.    ibuprofen (ADVIL,MOTRIN) 200 MG tablet Take 200 mg by mouth every 6 (six) hours as needed for headache, mild pain or moderate pain.    levothyroxine (SYNTHROID) 25 MCG tablet Take 25 mcg by mouth daily.   pantoprazole (PROTONIX) 40 MG tablet Take 40 mg by mouth daily.   pregabalin (LYRICA) 100 MG capsule Take 100 mg by mouth 3 (three) times daily.   sertraline (ZOLOFT) 100 MG tablet Take 100 mg by mouth daily.   SYNJARDY XR 09-998 MG TB24 Take 1,000 mg by mouth 2 (two) times daily.   traZODone (DESYREL) 50 MG tablet Take 50 mg by mouth at bedtime.    VENTOLIN HFA 108 (90 BASE) MCG/ACT inhaler Inhale 2 puffs into the lungs as needed for wheezing or shortness of breath.    fluticasone  (FLONASE ) 50 MCG/ACT nasal spray Place 2 sprays into both nostrils daily. (Patient not taking: Reported on 02/02/2024)   levocetirizine (XYZAL  ALLERGY 24HR) 5 MG tablet Take 1 tablet (5 mg total) by mouth every evening. (Patient not taking: Reported on 02/02/2024)   No facility-administered encounter medications on file as of 02/02/2024.   Vitals:   02/02/24 1111  BP: 122/60  Pulse: 73  Temp: (!) 97.5 F (36.4  C)  Height: 5' 8 (1.727 m)  Weight: 134 lb (60.8 kg)  SpO2: 99%  TempSrc: Oral  BMI (Calculated): 20.38     Physical Exam GEN: No acute distress CV: Regular rate and rhythm no murmurs LUNGS: Clear to auscultation bilaterally normal respiratory effort SKIN JOINTS: Warm and dry no rash    Data Reviewed: Imaging: CT  abdomen pelvis 05/05/2021-visualized lungs are clear. CT angio head and neck 07/10/2022-upper chest images showed large lung volumes, suspicious for pulmonary hyperinflation. Chest x-ray 12/30/2022-no active cardiopulmonary disease I had reviewed the images personally  PFTs: 12/16/2023  FVC 4.24 [110%], FEV1 3.25 [118%], F/F77, TLC 7.19 [108%],DLCO 22.91 [98%] Normal test  FENO 02/02/2024- 25  Labs:  Assessment:  Pulmonary hyperinflation Asthma This is read on CT head and neck on review of images of the lungs.  Follow-up chest x-ray does not show hyperinflation and there is no evidence of COPD or obstruction on PFTs  He may have moderate persistent asthma based on history and improvement in mid flow rates after bronchodilator response.  Low levels of FeNO indicate normal airway inflammation but he does note improvement of symptoms with use of albuterol Will check CBC with differential, IgE Trial Symbicort  Plan/Recommendations: Start Symbicort Check CBC differential, IgE  Vincent Coder Eaton [facility] 02/02/2024, 11:17 AM  CC: Vincent Nottingham, Eaton

## 2024-02-02 NOTE — Addendum Note (Signed)
 Addended by: MOODY RANCHER on: 02/02/2024 11:54 AM   Modules accepted: Orders

## 2024-02-03 LAB — IGE: IgE (Immunoglobulin E), Serum: 116 kU/L — ABNORMAL HIGH (ref <=114)

## 2024-02-13 ENCOUNTER — Ambulatory Visit: Payer: Self-pay | Admitting: Pulmonary Disease
